# Patient Record
Sex: Female | Born: 1985
Health system: Southern US, Community
[De-identification: ages and names within clinical notes are randomized; demographics above are authoritative.]

## PROBLEM LIST (undated history)

## (undated) DIAGNOSIS — T7840XA Allergy, unspecified, initial encounter: Secondary | ICD-10-CM

## (undated) DIAGNOSIS — J45909 Unspecified asthma, uncomplicated: Secondary | ICD-10-CM

## (undated) DIAGNOSIS — D649 Anemia, unspecified: Secondary | ICD-10-CM

## (undated) HISTORY — DX: Anemia, unspecified: D64.9

## (undated) HISTORY — PX: ADENOIDECTOMY: SUR15

## (undated) HISTORY — DX: Unspecified asthma, uncomplicated: J45.909

## (undated) HISTORY — DX: Allergy, unspecified, initial encounter: T78.40XA

## (undated) HISTORY — PX: TUBAL LIGATION: SHX77

## (undated) HISTORY — PX: TONSILLECTOMY: SUR1361

---

## 2011-03-02 ENCOUNTER — Emergency Department (HOSPITAL_BASED_OUTPATIENT_CLINIC_OR_DEPARTMENT_OTHER)
Admission: EM | Admit: 2011-03-02 | Discharge: 2011-03-02 | Disposition: A | Discharge status: Home / Self Care | Payer: Self-pay | Attending: Emergency Medicine | Admitting: Emergency Medicine

## 2011-03-02 ENCOUNTER — Encounter: Payer: Self-pay | Admitting: Emergency Medicine

## 2011-03-02 DIAGNOSIS — N390 Urinary tract infection, site not specified: Secondary | ICD-10-CM | POA: Insufficient documentation

## 2011-03-02 DIAGNOSIS — R109 Unspecified abdominal pain: Secondary | ICD-10-CM | POA: Insufficient documentation

## 2011-03-02 DIAGNOSIS — K625 Hemorrhage of anus and rectum: Secondary | ICD-10-CM | POA: Insufficient documentation

## 2011-03-02 LAB — WET PREP, GENITAL: Yeast Wet Prep HPF POC: NONE SEEN

## 2011-03-02 LAB — URINALYSIS, ROUTINE W REFLEX MICROSCOPIC
Ketones, ur: NEGATIVE mg/dL
Leukocytes, UA: NEGATIVE
Nitrite: NEGATIVE
Specific Gravity, Urine: 1.024 (ref 1.005–1.030)
pH: 5.5 (ref 5.0–8.0)

## 2011-03-02 LAB — PREGNANCY, URINE: Preg Test, Ur: NEGATIVE

## 2011-03-02 LAB — URINE MICROSCOPIC-ADD ON

## 2011-03-02 MED ORDER — NITROFURANTOIN MONOHYD MACRO 100 MG PO CAPS: 100.0000 mg | ORAL_CAPSULE | Freq: Two times a day (BID) | ORAL | Status: DC

## 2011-03-02 NOTE — ED Notes (Signed)
Pt reports lower abd pain with blood in stool x 1 episode yesterday.

## 2011-03-02 NOTE — ED Provider Notes (Signed)
History     CSN: [id number] Arrival date & time: 03/02/2011  6:36 AM   First MD Initiated Contact with Patient 03/02/11 [phone]      Chief Complaint  Patient presents with  . Abdominal Pain  . Rectal Bleeding    (Consider location/radiation/quality/duration/timing/severity/associated sxs/prior treatment) Patient is a 25 y.o. female presenting with abdominal pain and hematochezia. The history is provided by the patient.  Abdominal Pain The primary symptoms of the illness include abdominal pain and vaginal bleeding. The primary symptoms of the illness do not include fever, shortness of breath, nausea, vomiting, diarrhea, hematochezia, dysuria or vaginal discharge. The current episode started 2 days ago. The onset of the illness was gradual. The problem has been gradually worsening.  The abdominal pain is located in the suprapubic region. The abdominal pain radiates to the back. The severity of the abdominal pain is 3/10. The abdominal pain is relieved by nothing. The abdominal pain is exacerbated by certain positions and movement.  Onset: finishing her normal menses. The quantity of blood was typical of menses.  Additional symptoms associated with the illness include back pain. Symptoms associated with the illness do not include chills or anorexia.  Rectal Bleeding  Associated symptoms include abdominal pain and vaginal bleeding. Pertinent negatives include no anorexia, no fever, no diarrhea, no nausea, no vomiting and no vaginal discharge.    History reviewed. No pertinent past medical history.  History reviewed. No pertinent past surgical history.  No family history on file.  History  Substance Use Topics  . Smoking status: Never Smoker   . Smokeless tobacco: Not on file  . Alcohol Use: No    OB History    Grav Para Term Preterm Abortions TAB SAB Ect Mult Living                  Review of Systems  Constitutional: Negative for fever and chills.  Respiratory: Negative for  shortness of breath.   Gastrointestinal: Positive for abdominal pain. Negative for nausea, vomiting, diarrhea, hematochezia and anorexia.  Genitourinary: Positive for vaginal bleeding. Negative for dysuria and vaginal discharge.  Musculoskeletal: Positive for back pain.  All other systems reviewed and are negative.    Allergies  Penicillins  Home Medications   Current Outpatient Rx  Name Route Sig Dispense Refill  . NITROFURANTOIN MONOHYD MACRO 100 MG PO CAPS Oral Take 1 capsule (100 mg total) by mouth 2 (two) times daily. 14 capsule 0    BP 110/66  Pulse 71  Temp(Src) 97.7 F (36.5 C) (Oral)  Resp 18  SpO2 100%  LMP 02/25/2011  Physical Exam  Nursing note and vitals reviewed. Constitutional: She is oriented to person, place, and time. She appears well-developed and well-nourished. No distress.  HENT:  Head: Normocephalic and atraumatic.  Eyes: EOM are normal. Pupils are equal, round, and reactive to light.  Cardiovascular: Normal rate, regular rhythm, normal heart sounds and intact distal pulses.  Exam reveals no friction rub.   No murmur heard. Pulmonary/Chest: Effort normal and breath sounds normal. She has no wheezes. She has no rales.  Abdominal: Soft. Bowel sounds are normal. She exhibits no distension. There is tenderness in the suprapubic area. There is CVA tenderness. There is no rebound and no guarding.  Genitourinary: Uterus normal. Cervix exhibits no motion tenderness and no discharge. Right adnexum displays no mass and no tenderness. Left adnexum displays no mass and no tenderness. There is bleeding around the vagina. No vaginal discharge found.  Musculoskeletal: Normal range of  motion. She exhibits no tenderness.       No edema  Neurological: She is alert and oriented to person, place, and time. No cranial nerve deficit.  Skin: Skin is warm and dry. No rash noted.  Psychiatric: She has a normal mood and affect. Her behavior is normal.    ED Course    Procedures (including critical care time)  Results for orders placed during the hospital encounter of 03/02/11  URINALYSIS, ROUTINE W REFLEX MICROSCOPIC      Component Value Range   Color, Urine YELLOW  YELLOW    Appearance CLOUDY (*) CLEAR    Specific Gravity, Urine 1.024  1.005 - 1.030    pH 5.5  5.0 - 8.0    Glucose, UA NEGATIVE  NEGATIVE (mg/dL)   Hgb urine dipstick LARGE (*) NEGATIVE    Bilirubin Urine NEGATIVE  NEGATIVE    Ketones, ur NEGATIVE  NEGATIVE (mg/dL)   Protein, ur NEGATIVE  NEGATIVE (mg/dL)   Urobilinogen, UA 0.2  0.0 - 1.0 (mg/dL)   Nitrite NEGATIVE  NEGATIVE    Leukocytes, UA NEGATIVE  NEGATIVE   PREGNANCY, URINE      Component Value Range   Preg Test, Ur NEGATIVE    URINE MICROSCOPIC-ADD ON      Component Value Range   Squamous Epithelial / LPF RARE  RARE    WBC, UA 3-6  <3 (WBC/hpf)   RBC / HPF 3-6  <3 (RBC/hpf)   Bacteria, UA MANY (*) RARE    Urine-Other MUCOUS PRESENT    WET PREP, GENITAL      Component Value Range   Yeast, Wet Prep NONE SEEN  NONE SEEN    Trich, Wet Prep NONE SEEN  NONE SEEN    Clue Cells, Wet Prep NONE SEEN  NONE SEEN    WBC, Wet Prep HPF POC FEW (*) NONE SEEN    No results found.    1. UTI (lower urinary tract infection)       MDM   Pt with lower abd/suprapubic pain that is some in the back.  No RLQ pain concerning for appy.  Pt is married and denies any different sexual partners making PID unlikely. Denies fever, vomiting, or SOB.  No sx suggestive of pyelonephritis.  Denies any hx of ovarian cysts in the past. When asked about the blood in stool she states just some small streaks of blood which is most likely from constipation.  Pelvic exam without CMT and no adnexal tenderness.  UA suggestive of UTI with many bacteria.  Wet prep with only few WBC's but no trich or yeast.  Doubt gc/chlamydia. Will cover with macrobid and pt knows to return for worsening pain, vomiting or fever.        Gwyneth Sprout,  MD 03/02/11 [id number]

## 2011-03-02 NOTE — ED Notes (Signed)
Warm blankets given.

## 2011-03-03 LAB — GC/CHLAMYDIA PROBE AMP, GENITAL
Chlamydia, DNA Probe: NEGATIVE
GC Probe Amp, Genital: NEGATIVE

## 2011-04-17 ENCOUNTER — Encounter: Payer: Self-pay | Admitting: Internal Medicine

## 2011-04-17 DIAGNOSIS — Z0000 Encounter for general adult medical examination without abnormal findings: Secondary | ICD-10-CM | POA: Insufficient documentation

## 2011-04-20 ENCOUNTER — Ambulatory Visit: Payer: Self-pay | Admitting: Internal Medicine

## 2011-04-20 DIAGNOSIS — Z0289 Encounter for other administrative examinations: Secondary | ICD-10-CM

## 2012-02-06 ENCOUNTER — Ambulatory Visit (INDEPENDENT_AMBULATORY_CARE_PROVIDER_SITE_OTHER): Payer: BC Managed Care – PPO | Admitting: Family Medicine

## 2012-02-06 VITALS — BP 104/64 | HR 83 | Temp 98.2°F | Resp 16 | Ht 61.0 in | Wt 112.4 lb

## 2012-02-06 DIAGNOSIS — R109 Unspecified abdominal pain: Secondary | ICD-10-CM

## 2012-02-06 DIAGNOSIS — M436 Torticollis: Secondary | ICD-10-CM

## 2012-02-06 DIAGNOSIS — N898 Other specified noninflammatory disorders of vagina: Secondary | ICD-10-CM

## 2012-02-06 LAB — POCT WET PREP WITH KOH
Epithelial Wet Prep HPF POC: NEGATIVE
KOH Prep POC: NEGATIVE
Trichomonas, UA: NEGATIVE
WBC Wet Prep HPF POC: NEGATIVE
Yeast Wet Prep HPF POC: NEGATIVE

## 2012-02-06 LAB — POCT UA - MICROSCOPIC ONLY
Casts, Ur, LPF, POC: NEGATIVE
Crystals, Ur, HPF, POC: NEGATIVE

## 2012-02-06 LAB — POCT URINE PREGNANCY: Preg Test, Ur: NEGATIVE

## 2012-02-06 LAB — POCT URINALYSIS DIPSTICK
Ketones, UA: NEGATIVE
Protein, UA: NEGATIVE
Urobilinogen, UA: 1

## 2012-02-06 MED ORDER — METRONIDAZOLE 500 MG PO TABS: 500.0000 mg | ORAL_TABLET | Freq: Three times a day (TID) | ORAL | Status: DC

## 2012-02-06 NOTE — Patient Instructions (Addendum)
Bacterial Vaginosis Bacterial vaginosis (BV) is a vaginal infection where the normal balance of bacteria in the vagina is disrupted. The normal balance is then replaced by an overgrowth of certain bacteria. There are several different kinds of bacteria that can cause BV. BV is the most common vaginal infection in women of childbearing age. CAUSES   The cause of BV is not fully understood. BV develops when there is an increase or imbalance of harmful bacteria.  Some activities or behaviors can upset the normal balance of bacteria in the vagina and put women at increased risk including:  Having a new sex partner or multiple sex partners.  Douching.  Using an intrauterine device (IUD) for contraception.  It is not clear what role sexual activity plays in the development of BV. However, women that have never had sexual intercourse are rarely infected with BV. Women do not get BV from toilet seats, bedding, swimming pools or from touching objects around them.  SYMPTOMS   Grey vaginal discharge.  A fish-like odor with discharge, especially after sexual intercourse.  Itching or burning of the vagina and vulva.  Burning or pain with urination.  Some women have no signs or symptoms at all. DIAGNOSIS  Your caregiver must examine the vagina for signs of BV. Your caregiver will perform lab tests and look at the sample of vaginal fluid through a microscope. They will look for bacteria and abnormal cells (clue cells), a pH test higher than 4.5, and a positive amine test all associated with BV.  RISKS AND COMPLICATIONS   Pelvic inflammatory disease (PID).  Infections following gynecology surgery.  Developing HIV.  Developing herpes virus. TREATMENT  Sometimes BV will clear up without treatment. However, all women with symptoms of BV should be treated to avoid complications, especially if gynecology surgery is planned. Female partners generally do not need to be treated. However, BV may spread  between female sex partners so treatment is helpful in preventing a recurrence of BV.   BV may be treated with antibiotics. The antibiotics come in either pill or vaginal cream forms. Either can be used with nonpregnant or pregnant women, but the recommended dosages differ. These antibiotics are not harmful to the baby.  BV can recur after treatment. If this happens, a second round of antibiotics will often be prescribed.  Treatment is important for pregnant women. If not treated, BV can cause a premature delivery, especially for a pregnant woman who had a premature birth in the past. All pregnant women who have symptoms of BV should be checked and treated.  For chronic reoccurrence of BV, treatment with a type of prescribed gel vaginally twice a week is helpful. HOME CARE INSTRUCTIONS   Finish all medication as directed by your caregiver.  Do not have sex until treatment is completed.  Tell your sexual partner that you have a vaginal infection. They should see their caregiver and be treated if they have problems, such as a mild rash or itching.  Practice safe sex. Use condoms. Only have 1 sex partner. PREVENTION  Basic prevention steps can help reduce the risk of upsetting the natural balance of bacteria in the vagina and developing BV:  Do not have sexual intercourse (be abstinent).  Do not douche.  Use all of the medicine prescribed for treatment of BV, even if the signs and symptoms go away.  Tell your sex partner if you have BV. That way, they can be treated, if needed, to prevent reoccurrence. SEEK MEDICAL CARE IF:     Your symptoms are not improving after 3 days of treatment.  You have increased discharge, pain, or fever. MAKE SURE YOU:   Understand these instructions.  Will watch your condition.  Will get help right away if you are not doing well or get worse. FOR MORE INFORMATION  Division of STD Prevention (DSTDP), Centers for Disease Control and Prevention:  www.cdc.gov/std American [employer] Association (ASHA): www.ashastd.org  Document Released: 03/22/2005 Document Revised: 06/14/2011 Document Reviewed: 09/12/2008 [employer] Patient Information 2013 [employer].  

## 2012-02-06 NOTE — Progress Notes (Signed)
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  Notices foul odor as well.    Bleeding after intercourse.  Married, monogamous.  Dyspareunia.  Stressed with job, marriage, son recently diagnosed with Crohn's disease  LMP:  Sept 15th G3P3 S/P tubal ligation Last pap 2 years ago, normal  Objective:  NAD  Results for orders placed in visit on 02/06/12  POCT URINALYSIS DIPSTICK      Component Value Range   Color, UA yellow     Clarity, UA clear     Glucose, UA neg     Bilirubin, UA neg     Ketones, UA neg     Spec Grav, UA >=1.030     Blood, UA neg     pH, UA 5.5     Protein, UA neg     Urobilinogen, UA 1.0     Nitrite, UA neg     Leukocytes, UA Trace     Breast exam ( at patient's request): Normal Pelvic exam: Normal cervix, scant vaginal discharge, no friability of the cervix, normal bimanual, and normal external female genitalia  Assessment: Vaginitis  1. Abdominal  pain, other specified site  POCT UA - Microscopic Only, POCT urinalysis dipstick, POCT urine pregnancy, POCT Wet Prep with KOH, GC/chlamydia probe amp, genital  2. Vaginal discharge  POCT Wet Prep with KOH, GC/chlamydia probe amp, genital, metroNIDAZOLE (FLAGYL) 500 MG tablet

## 2012-02-08 LAB — GC/CHLAMYDIA PROBE AMP, GENITAL
Chlamydia, DNA Probe: NEGATIVE
GC Probe Amp, Genital: NEGATIVE

## 2012-03-10 ENCOUNTER — Encounter: Payer: Self-pay | Admitting: Family Medicine

## 2013-01-06 ENCOUNTER — Encounter (HOSPITAL_COMMUNITY): Payer: Self-pay | Admitting: *Deleted

## 2013-01-06 ENCOUNTER — Emergency Department (HOSPITAL_COMMUNITY): Payer: BC Managed Care – PPO

## 2013-01-06 ENCOUNTER — Emergency Department (HOSPITAL_COMMUNITY)
Admission: EM | Admit: 2013-01-06 | Discharge: 2013-01-06 | Disposition: A | Discharge status: Home / Self Care | Payer: BC Managed Care – PPO | Attending: Emergency Medicine | Admitting: Emergency Medicine

## 2013-01-06 DIAGNOSIS — Z792 Long term (current) use of antibiotics: Secondary | ICD-10-CM | POA: Insufficient documentation

## 2013-01-06 DIAGNOSIS — Z880 Allergy status to penicillin: Secondary | ICD-10-CM | POA: Insufficient documentation

## 2013-01-06 DIAGNOSIS — Y929 Unspecified place or not applicable: Secondary | ICD-10-CM | POA: Insufficient documentation

## 2013-01-06 DIAGNOSIS — S9030XA Contusion of unspecified foot, initial encounter: Secondary | ICD-10-CM | POA: Insufficient documentation

## 2013-01-06 DIAGNOSIS — W208XXA Other cause of strike by thrown, projected or falling object, initial encounter: Secondary | ICD-10-CM | POA: Insufficient documentation

## 2013-01-06 DIAGNOSIS — Y9389 Activity, other specified: Secondary | ICD-10-CM | POA: Insufficient documentation

## 2013-01-06 DIAGNOSIS — S9032XA Contusion of left foot, initial encounter: Secondary | ICD-10-CM

## 2013-01-06 MED ORDER — IBUPROFEN 800 MG PO TABS: 800.0000 mg | ORAL_TABLET | Freq: Three times a day (TID) | ORAL | Status: DC

## 2013-01-06 MED ORDER — IBUPROFEN 400 MG PO TABS
800.0000 mg | ORAL_TABLET | Freq: Once | ORAL | Status: AC
  Administered 2013-01-06: 800 mg via ORAL
  Filled 2013-01-06: qty 2

## 2013-01-06 NOTE — ED Provider Notes (Signed)
CSN: [id number]     Arrival date & time 01/06/13  1103 History  This chart was scribed for non-physician practitioner, Trixie Dredge, PA-C working with Shanna Cisco, MD by Greggory Stallion, ED scribe. This patient was seen in room TR06C/TR06C and the patient's care was started at 11:37 AM.   Chief Complaint  Patient presents with  . Foot Injury   The history is provided by the patient. No language interpreter was used.    HPI Comments: Nancy Mcmahon is a 27 y.o. female who presents to the Emergency Department complaining of left foot injury that occurred about one hour ago. She states she dropped a full laundry basket on her foot. Pt is now having sudden onset, constant left foot pain with associated mild swelling. She states movement and palpation worsen the pain. Pt denies weakness and numbness. Denies other injury.   History reviewed. No pertinent past medical history. History reviewed. No pertinent past surgical history. Family History  Problem Relation Age of Onset  . Seizures Mother   . Cirrhosis Father   . Crohn's disease Sister   . Cancer Maternal Grandfather    History  Substance Use Topics  . Smoking status: Never Smoker   . Smokeless tobacco: Not on file  . Alcohol Use: No   OB History   Grav Para Term Preterm Abortions TAB SAB Ect Mult Living                 Review of Systems  Musculoskeletal: Positive for arthralgias.       Positive swelling to left dorsal foot.  Neurological: Negative for weakness and numbness.  All other systems reviewed and are negative.    Allergies  Penicillins  Home Medications   Current Outpatient Rx  Name  Route  Sig  Dispense  Refill  . metroNIDAZOLE (FLAGYL) 500 MG tablet   Oral   Take 1 tablet (500 mg total) by mouth 3 (three) times daily.   21 tablet   0    BP 121/64  Pulse 100  Temp(Src) 97.6 F (36.4 C) (Oral)  Resp 20  Wt 119 lb 6 oz (54.148 kg)  BMI 22.57 kg/m2  SpO2 100%  Physical Exam  Nursing note and  vitals reviewed. Constitutional: She appears well-developed and well-nourished. No distress.  HENT:  Head: Normocephalic and atraumatic.  Neck: Neck supple.  Pulmonary/Chest: Effort normal.  Musculoskeletal:  Diffuse tenderness over left lateral malleolus and left dorsal foot. Mild swelling over dorsal foot. No erythema. No ecchymosis. No tenderness over plantar aspect of foot. Capillary refill less than 2 seconds on all digits. Pt has decreased active ROM over all digits and ankle secondary to pain. Left calf is non tender. Skin is intact.   Neurological: She is alert.  Sensation intact.   Skin: She is not diaphoretic.    ED Course  Procedures (including critical care time)  DIAGNOSTIC STUDIES: Oxygen Saturation is 100% on RA, normal by my interpretation.    COORDINATION OF CARE: 11:40 AM-Discussed treatment plan which includes xrays with pt at bedside and pt agreed to plan.   Labs Review Labs Reviewed - No data to display Imaging Review Dg Ankle Complete Left  01/06/2013   CLINICAL DATA:  Injury with left ankle pain.  EXAM: LEFT ANKLE COMPLETE - 3+ VIEW  COMPARISON:  None.  FINDINGS: There is no evidence of fracture, dislocation, or joint effusion. There is no evidence of arthropathy or other focal bone abnormality. Soft tissues are unremarkable.  IMPRESSION:  Negative.   Electronically Signed   By: Irish Lack M.D.   On: 01/06/2013 12:25   Dg Foot Complete Left  01/06/2013   CLINICAL DATA:  Injury with left foot pain.  EXAM: LEFT FOOT - COMPLETE 3+ VIEW  COMPARISON:  None.  FINDINGS: There is no evidence of fracture or dislocation. There is no evidence of arthropathy or other focal bone abnormality. Soft tissues are unremarkable.  IMPRESSION: Negative.   Electronically Signed   By: Irish Lack M.D.   On: 01/06/2013 12:28    MDM   1. Foot contusion, left, initial encounter      Pt p/w pain and swelling over left dorsal foot and anterior/lateral ankle after dropping a  laundry basket full of clothes on her foot.  Pt with significant tenderness but no ecchymosis, no break in skin.  Neurovascularly intact.  Xrays negative Pt given ice, ibuprofen in ED. D/C home with RICE, ace wrap, crutches.  Discussed all results with patient.  Pt given return precautions.  Pt verbalizes understanding and agrees with plan.         I personally performed the services described in this documentation, which was scribed in my presence. The recorded information has been reviewed and is accurate.   Trixie Dredge, PA-C 01/06/13 1241

## 2013-01-06 NOTE — ED Notes (Signed)
PT IS HERE WITH LEFT FOOT INJURY AFTER DROPPING A FULL LAUNDRY BASKET ON IT TODAY.

## 2013-01-06 NOTE — ED Provider Notes (Signed)
Medical screening examination/treatment/procedure(s) were performed by non-physician practitioner and as supervising physician I was immediately available for consultation/collaboration.  Kiyoko Mcguirt E Geneva Pallas, MD 01/06/13 1244 

## 2014-10-19 ENCOUNTER — Ambulatory Visit (INDEPENDENT_AMBULATORY_CARE_PROVIDER_SITE_OTHER): Payer: BC Managed Care – PPO | Admitting: Family Medicine

## 2014-10-19 VITALS — BP 110/60 | HR 98 | Temp 98.7°F | Resp 18 | Ht 61.0 in | Wt 123.1 lb

## 2014-10-19 DIAGNOSIS — R102 Pelvic and perineal pain: Secondary | ICD-10-CM | POA: Diagnosis not present

## 2014-10-19 DIAGNOSIS — N898 Other specified noninflammatory disorders of vagina: Secondary | ICD-10-CM

## 2014-10-19 DIAGNOSIS — B373 Candidiasis of vulva and vagina: Secondary | ICD-10-CM

## 2014-10-19 DIAGNOSIS — B3731 Acute candidiasis of vulva and vagina: Secondary | ICD-10-CM

## 2014-10-19 LAB — POCT WET PREP WITH KOH
Clue Cells Wet Prep HPF POC: NEGATIVE
KOH PREP POC: POSITIVE
RBC WET PREP PER HPF POC: NEGATIVE
Trichomonas, UA: NEGATIVE

## 2014-10-19 LAB — POCT UA - MICROSCOPIC ONLY
CASTS, UR, LPF, POC: NEGATIVE
Crystals, Ur, HPF, POC: NEGATIVE
YEAST UA: NEGATIVE

## 2014-10-19 LAB — POCT URINALYSIS DIPSTICK
BILIRUBIN UA: NEGATIVE
GLUCOSE UA: NEGATIVE
KETONES UA: NEGATIVE
LEUKOCYTES UA: NEGATIVE
NITRITE UA: NEGATIVE
PH UA: 5
Protein, UA: NEGATIVE
Spec Grav, UA: >=1.03
UROBILINOGEN UA: 0.2

## 2014-10-19 MED ORDER — FLUCONAZOLE 150 MG PO TABS: 150.0000 mg | ORAL_TABLET | Freq: Once | ORAL | Status: DC

## 2014-10-19 NOTE — Patient Instructions (Addendum)
Take fluconazole 150 mg single dose for yeast. If it does not seem completely gone within about 3 days go ahead and take the second pill, otherwise he did not need it.  If you keep having excessive pains and gas pain come in for recheck.  Vaginitis Vaginitis is an inflammation of the vagina. It is most often caused by a change in the normal balance of the bacteria and yeast that live in the vagina. This change in balance causes an overgrowth of certain bacteria or yeast, which causes the inflammation. There are different types of vaginitis, but the most common types are:  Bacterial vaginosis.  Yeast infection (candidiasis).  Trichomoniasis vaginitis. This is a sexually transmitted infection (STI).  Viral vaginitis.  Atropic vaginitis.  Allergic vaginitis. CAUSES  The cause depends on the type of vaginitis. Vaginitis can be caused by:  Bacteria (bacterial vaginosis).  Yeast (yeast infection).  A parasite (trichomoniasis vaginitis)  A virus (viral vaginitis).  Low hormone levels (atrophic vaginitis). Low hormone levels can occur during pregnancy, breastfeeding, or after menopause.  Irritants, such as bubble baths, scented tampons, and feminine sprays (allergic vaginitis). Other factors can change the normal balance of the yeast and bacteria that live in the vagina. These include:  Antibiotic medicines.  Poor hygiene.  Diaphragms, vaginal sponges, spermicides, birth control pills, and intrauterine devices (IUD).  Sexual intercourse.  Infection.  Uncontrolled diabetes.  A weakened immune system. SYMPTOMS  Symptoms can vary depending on the cause of the vaginitis. Common symptoms include:  Abnormal vaginal discharge.  The discharge is white, gray, or yellow with bacterial vaginosis.  The discharge is thick, white, and cheesy with a yeast infection.  The discharge is frothy and yellow or greenish with trichomoniasis.  A bad vaginal odor.  The odor is fishy with  bacterial vaginosis.  Vaginal itching, pain, or swelling.  Painful intercourse.  Pain or burning when urinating. Sometimes, there are no symptoms. TREATMENT  Treatment will vary depending on the type of infection.   Bacterial vaginosis and trichomoniasis are often treated with antibiotic creams or pills.  Yeast infections are often treated with antifungal medicines, such as vaginal creams or suppositories.  Viral vaginitis has no cure, but symptoms can be treated with medicines that relieve discomfort. Your sexual partner should be treated as well.  Atrophic vaginitis may be treated with an estrogen cream, pill, suppository, or vaginal ring. If vaginal dryness occurs, lubricants and moisturizing creams may help. You may be told to avoid scented soaps, sprays, or douches.  Allergic vaginitis treatment involves quitting the use of the product that is causing the problem. Vaginal creams can be used to treat the symptoms. HOME CARE INSTRUCTIONS   Take all medicines as directed by your caregiver.  Keep your genital area clean and dry. Avoid soap and only rinse the area with water.  Avoid douching. It can remove the healthy bacteria in the vagina.  Do not use tampons or have sexual intercourse until your vaginitis has been treated. Use sanitary pads while you have vaginitis.  Wipe from front to back. This avoids the spread of bacteria from the rectum to the vagina.  Let air reach your genital area.  Wear cotton underwear to decrease moisture buildup.  Avoid wearing underwear while you sleep until your vaginitis is gone.  Avoid tight pants and underwear or nylons without a cotton panel.  Take off wet clothing (especially bathing suits) as soon as possible.  Use mild, non-scented products. Avoid using irritants, such as:  Scented feminine sprays.  Fabric softeners.  Scented detergents.  Scented tampons.  Scented soaps or bubble baths.  Practice safe sex and use condoms.  Condoms may prevent the spread of trichomoniasis and viral vaginitis. SEEK MEDICAL CARE IF:   You have abdominal pain.  You have a fever or persistent symptoms for more than 2-3 days.  You have a fever and your symptoms suddenly get worse. Document Released: 01/17/2007 Document Revised: 12/15/2011 Document Reviewed: 09/02/2011 [facility] Patient Information 2015 [place], [place]. This information is not intended to replace advice given to you by your health care provider. Make sure you discuss any questions you have with your health care provider.

## 2014-10-19 NOTE — Progress Notes (Signed)
Subjective:  Patient ID: Fontaine No, female    DOB: 04-07-1985  Age: 29 y.o. MRN: [id number]  29 year old female who has been having pelvic discomfort and a vaginal discharge for the last few days. The labia feel swollen. She says she's been red down there. She has seen a little bit of dark blood. She's not had any abdominal pain except for the chronic abdominal pain that she always has had for years. She has 3 children and a fourth child she is raising. She works full-time. She is married. Has not had intercourse since early in the week. She uses powder around her vaginal orifice and wondered whether she got some in there to irritate things.   Objective:   Abdomen is soft with some mild nonspecific low abdominal tenderness. Normal external genitalia. It really doesn't look terribly swollen even though feels that way to her. Minimal erythema. Vaginal mucosa looks normal except for black old blood discharge is very thick and clumps. This was swabbed out. Wet prep was taken. Bimanual exam reveals uterus to be multiparous, retroflexed somewhat, no adnexal or uterine masses could be appreciated. No significant tenderness.  Results for orders placed or performed in visit on 10/19/14  POCT Wet Prep with KOH  Result Value Ref Range   Trichomonas, UA Negative    Clue Cells Wet Prep HPF POC Negative    Epithelial Wet Prep HPF POC Few Few, Moderate, Many   Yeast Wet Prep HPF POC Few    Bacteria Wet Prep HPF POC Many (A) Few   RBC Wet Prep HPF POC Negative    WBC Wet Prep HPF POC Rare    KOH Prep POC Positive   POCT UA - Microscopic Only  Result Value Ref Range   WBC, Ur, HPF, POC 1-5    RBC, urine, microscopic 0-1    Bacteria, U Microscopic 2+    Mucus, UA Small    Epithelial cells, urine per micros 1-5    Crystals, Ur, HPF, POC Negative    Casts, Ur, LPF, POC Negative    Yeast, UA Negative   POCT urinalysis dipstick  Result Value Ref Range   Color, UA yellow    Clarity, UA clear    Glucose, UA negative    Bilirubin, UA negative    Ketones, UA negative    Spec Grav, UA >=1.030    Blood, UA trace-lysed    pH, UA 5.0    Protein, UA negative    Urobilinogen, UA 0.2    Nitrite, UA negative    Leukocytes, UA Negative Negative        Assessment & Plan:   Assessment: monilia vaginitis  Plan: Patient Instructions  Take fluconazole 150 mg single dose for yeast. If it does not seem completely gone within about 3 days go ahead and take the second pill, otherwise he did not need it.  If you keep having excessive pains and gas pain come in for recheck.     Allenmichael Mcpartlin, MD 10/19/2014

## 2016-04-06 ENCOUNTER — Ambulatory Visit (INDEPENDENT_AMBULATORY_CARE_PROVIDER_SITE_OTHER): Payer: BC Managed Care – PPO | Admitting: Physician Assistant

## 2016-04-06 VITALS — BP 102/78 | HR 90 | Temp 98.6°F | Resp 16 | Ht 61.0 in | Wt 121.0 lb

## 2016-04-06 DIAGNOSIS — R109 Unspecified abdominal pain: Secondary | ICD-10-CM

## 2016-04-06 MED ORDER — OMEPRAZOLE 20 MG PO CPDR: 20.0000 mg | DELAYED_RELEASE_CAPSULE | Freq: Every day | ORAL | 1 refills | Status: DC

## 2016-04-06 MED ORDER — RANITIDINE HCL 150 MG PO TABS: 150.0000 mg | ORAL_TABLET | Freq: Two times a day (BID) | ORAL | 0 refills | Status: DC

## 2016-04-06 NOTE — Patient Instructions (Addendum)
Please continue to keep a food diary.   Please lower your caffeine intake.  If you continue to have these symptoms, I would like you to follow up with [place] in 4 weeks.  Sooner if your symptoms worsen.   You can also use a probiotic supplement with the medication prescribed. Food Choices for Gastroesophageal Reflux Disease, Adult When you have gastroesophageal reflux disease (GERD), the foods you eat and your eating habits are very important. Choosing the right foods can help ease your discomfort. What guidelines do I need to follow?  Choose fruits, vegetables, whole grains, and low-fat dairy products.  Choose low-fat meat, fish, and poultry.  Limit fats such as oils, salad dressings, butter, nuts, and avocado.  Keep a food diary. This helps you identify foods that cause symptoms.  Avoid foods that cause symptoms. These may be different for everyone.  Eat small meals often instead of 3 large meals a day.  Eat your meals slowly, in a place where you are relaxed.  Limit fried foods.  Cook foods using methods other than frying.  Avoid drinking alcohol.  Avoid drinking large amounts of liquids with your meals.  Avoid bending over or lying down until 2-3 hours after eating. What foods are not recommended? These are some foods and drinks that may make your symptoms worse: Vegetables  Tomatoes. Tomato juice. Tomato and spaghetti sauce. Chili peppers. Onion and garlic. Horseradish. Fruits  Oranges, grapefruit, and lemon (fruit and juice). Meats  High-fat meats, fish, and poultry. This includes hot dogs, ribs, ham, sausage, salami, and bacon. Dairy  Whole milk and chocolate milk. Sour cream. Cream. Butter. Ice cream. Cream cheese. Drinks  Coffee and tea. Bubbly (carbonated) drinks or energy drinks. Condiments  Hot sauce. Barbecue sauce. Sweets/Desserts  Chocolate and cocoa. Donuts. Peppermint and spearmint. Fats and Oils  High-fat foods. This includes [place] fries and potato  chips. Other  Vinegar. Strong spices. This includes black pepper, white pepper, red pepper, cayenne, curry powder, cloves, ginger, and chili powder. The items listed above may not be a complete list of foods and drinks to avoid. Contact your dietitian for more information.  This information is not intended to replace advice given to you by your health care provider. Make sure you discuss any questions you have with your health care provider. Document Released: 09/21/2011 Document Revised: 08/28/2015 Document Reviewed: 01/24/2013 Elsevier Interactive Patient Education  2017 [employer].

## 2016-04-06 NOTE — Progress Notes (Signed)
[facility] [address] [place] [address] [phone]  Date:  04/06/2016   Name:  Nancy Mcmahon   DOB:  1986/03/07   MRN:  [id number]  PCP:  Cathlean Cower, MD    History of Present Illness:  Nancy Mcmahon is a 31 y.o. female patient who presents to [facility] for abdominal pain and nausea.    6 months of abdomianl pain at the lower gi and down radiates through back, and at times down legs.  She has nausea associated pain.  No diarrhea or constipation.   Anytime she eats onions, tomatoes, or red sauce, the pain is worsened.   Drink insures, coffee (2 cups).  Son has hx of crohn's disease.  Pain subsides.   She is taking ibuprofen 4 times per week, 400mg  per week. Soda intake rare EtOH: none.   Patient Active Problem List   Diagnosis Date Noted  . Preventative health care 04/17/2011    Past Medical History:  Diagnosis Date  . Allergy     Past Surgical History:  Procedure Laterality Date  . TUBAL LIGATION      Social History  Substance Use Topics  . Smoking status: Never Smoker  . Smokeless tobacco: Not on file  . Alcohol use No    Family History  Problem Relation Age of Onset  . Seizures Mother   . Cirrhosis Father   . Crohn's disease Sister   . Cancer Maternal Grandfather     Allergies  Allergen Reactions  . Penicillins Rash    Medication list has been reviewed and updated.  Current Outpatient Prescriptions on File Prior to Visit  Medication Sig Dispense Refill  . fluconazole (DIFLUCAN) 150 MG tablet Take 1 tablet (150 mg total) by mouth once. (Patient not taking: Reported on 04/06/2016) 2 tablet 0  . ibuprofen (ADVIL,MOTRIN) 200 MG tablet Take 400 mg by mouth every 6 (six) hours as needed for pain.     No current facility-administered medications on file prior to visit.     ROS ROS otherwise unremarkable unless listed above.   Physical Examination: BP 102/78 (BP Location: Right Arm, Patient Position: Sitting, Cuff Size: Normal)    Pulse 90   Temp 98.6 F (37 C)   Resp 16   Ht 5\' 1"  (1.549 m)   Wt 121 lb (54.9 kg)   LMP 03/25/2016   SpO2 100%   BMI 22.86 kg/m  Ideal Body Weight: Weight in (lb) to have BMI = 25: 132  Physical Exam  Constitutional: She is oriented to person, place, and time. She appears well-developed and well-nourished. No distress.  HENT:  Head: Normocephalic and atraumatic.  Right Ear: External ear normal.  Left Ear: External ear normal.  Eyes: Conjunctivae and EOM are normal. Pupils are equal, round, and reactive to light.  Cardiovascular: Normal rate.   Pulmonary/Chest: Effort normal. No respiratory distress.  Abdominal: Soft. Normal appearance and bowel sounds are normal.  Neurological: She is alert and oriented to person, place, and time.  Skin: She is not diaphoretic.  Psychiatric: She has a normal mood and affect. Her behavior is normal.     Assessment and Plan: [name] is a 31 y.o. female who is here today for cc abdominal pain or nausea. Advised if pain continues without improvement within 2 weeks, return for workup.   Also given probiotic supplement.  Abdominal pain, unspecified abdominal location - Plan: ranitidine (ZANTAC) 150 MG tablet, omeprazole (PRILOSEC) 20 MG capsule  Ivar Drape, PA-C [facility] 1/7/20185:42 PM

## 2016-08-12 ENCOUNTER — Ambulatory Visit (INDEPENDENT_AMBULATORY_CARE_PROVIDER_SITE_OTHER): Payer: BC Managed Care – PPO | Admitting: Family Medicine

## 2016-08-12 ENCOUNTER — Encounter: Payer: Self-pay | Admitting: Physician Assistant

## 2016-08-12 VITALS — BP 113/72 | HR 95 | Temp 98.2°F | Resp 18 | Ht 62.01 in | Wt 122.6 lb

## 2016-08-12 DIAGNOSIS — Z01419 Encounter for gynecological examination (general) (routine) without abnormal findings: Secondary | ICD-10-CM | POA: Diagnosis not present

## 2016-08-12 DIAGNOSIS — Z0000 Encounter for general adult medical examination without abnormal findings: Secondary | ICD-10-CM | POA: Diagnosis not present

## 2016-08-12 DIAGNOSIS — N898 Other specified noninflammatory disorders of vagina: Secondary | ICD-10-CM

## 2016-08-12 DIAGNOSIS — D509 Iron deficiency anemia, unspecified: Secondary | ICD-10-CM | POA: Diagnosis not present

## 2016-08-12 DIAGNOSIS — D239 Other benign neoplasm of skin, unspecified: Secondary | ICD-10-CM | POA: Diagnosis not present

## 2016-08-12 DIAGNOSIS — R1030 Lower abdominal pain, unspecified: Secondary | ICD-10-CM | POA: Insufficient documentation

## 2016-08-12 LAB — POCT WET + KOH PREP
TRICH BY WET PREP: ABSENT
YEAST BY KOH: ABSENT
YEAST BY WET PREP: ABSENT

## 2016-08-12 LAB — POCT URINALYSIS DIP (MANUAL ENTRY)
BILIRUBIN UA: NEGATIVE mg/dL
Bilirubin, UA: NEGATIVE
Blood, UA: NEGATIVE
Glucose, UA: NEGATIVE mg/dL
LEUKOCYTES UA: NEGATIVE
Nitrite, UA: NEGATIVE
PH UA: 5 (ref 5.0–8.0)
PROTEIN UA: NEGATIVE mg/dL
Spec Grav, UA: >=1.03 — AB (ref 1.010–1.025)
UROBILINOGEN UA: 0.2 U/dL

## 2016-08-12 LAB — POCT CBC
GRANULOCYTE PERCENT: 67.6 % (ref 37–80)
HCT, POC: 32 % — AB (ref 37.7–47.9)
HEMOGLOBIN: 10.5 g/dL — AB (ref 12.2–16.2)
Lymph, poc: 1.8 (ref 0.6–3.4)
MCH: 27.2 pg (ref 27–31.2)
MCHC: 32.7 g/dL (ref 31.8–35.4)
MCV: 83.2 fL (ref 80–97)
MID (cbc): 0.4 (ref 0–0.9)
MPV: 7.8 fL (ref 0–99.8)
POC Granulocyte: 4.7 (ref 2–6.9)
POC LYMPH PERCENT: 26.2 %L (ref 10–50)
POC MID %: 6.2 %M (ref 0–12)
Platelet Count, POC: 270 10*3/uL (ref 142–424)
RBC: 3.85 M/uL — AB (ref 4.04–5.48)
RDW, POC: 15.6 %
WBC: 7 10*3/uL (ref 4.6–10.2)

## 2016-08-12 LAB — POC MICROSCOPIC URINALYSIS (UMFC): MUCUS RE: ABSENT

## 2016-08-12 NOTE — Progress Notes (Signed)
Patient ID: Nancy Mcmahon, female    DOB: 30-Jun-1985  Age: 31 y.o. MRN: [id number]  Chief Complaint  Patient presents with  . Annual Exam    CPE    Subjective:   Annual physical examination:  History: Patient is here for an annual physical examination. She has no major complaints, but felt like it was time for her to get a good going over. It turns out she does have some chronic low abdominal pains and was a little concerned about them. She has generally been a very healthy person.  Past medical history: Operations: Bilateral tubal ligation Medical illnesses: None Allergies: Possibly penicillin causing hives Current medications: None Gynecological: Periods are regular, usually about 6 days. She has pain in the first 2 days of her cycle. Is gravida 3 para 3  Family history: Mother is alive and well. Father died of cirrhosis.  Social history: Does not drink or smoke or use substances. Her father's drinking made her turn away from all alcohol. Lives with her husband and 4 children, 3 of which were her natural children children under believe the fourth is adopted. Is sexually involved with her husband only. No fear sexually transmitted diseases.  Review of systems: Constitutional: Unremarkable HEENT: Unremarkable Cardiovascular: Unremarkable Respiratory: Unremarkable GI: Unremarkable except as noted above. She bowels move every 2-3 days.  GU unremarkable.  Musculoskeletal unremarkable.  Neurologic unremarkable.  Dermatologic the dermatofibromas as noted. Psychiatric unremarkable:    Current allergies, medications, problem list, past/family and social histories reviewed.  Objective:  BP 113/72 (BP Location: Right Arm, Patient Position: Sitting, Cuff Size: Small)   Pulse 95   Temp 98.2 F (36.8 C) (Oral)   Resp 18   Ht 5' 2.01" (1.575 m)   Wt 122 lb 9.6 oz (55.6 kg)   LMP 08/02/2016 (Approximate)   SpO2 98%   BMI 22.42 kg/m    Well-developed well-nourished young  lady in no acute distress. Her TMs are normal. Eyes PERRLA. Was glasses. Throat clear. Has the need of a little dental work. Neck supple without nodes or thyromegaly. No carotid bruits. She says her neck pops some but I couldn't get it to pop any on full range of motion. Chest is clear drawstrings. Heart regular without murmurs. Breasts are symmetrical, soft, no masses. No axillary nodes. Normal bowel sounds. Abdomen soft without masses. Mild nonspecific lower abdominal tenderness, slightly to the right of midline. Pelvic normal external genitalia. Vaginal mucosa unremarkable. Had a little bit of a whitish discharge charge so wet prep is taken. Bimanual exam reveals a multiparous uterus and large cervix. No masses. Ovaries could be palpated and deemed to be normal. Extremities unremarkable. Skin has a couple dermatofibromas, one on her left buttock, one on her left ankle, and 1 on her right leg believe.  Assessment & Plan:   Assessment: 1. Annual physical exam   2. Well woman exam   3. Lower abdominal pain   4. Dermatofibroma   5. Vaginal discharge   6. Iron deficiency anemia, unspecified iron deficiency anemia type       Plan:   Orders Placed This Encounter  Procedures  . Comprehensive metabolic panel  . Lipid panel  . HIV antibody  . POCT CBC  . POCT Wet + KOH Prep  . POCT urinalysis dipstick  . POCT Microscopic Urinalysis ([facility])    No orders of the defined types were placed in this encounter.    Results for orders placed or performed in visit on 08/12/16  POCT  CBC  Result Value Ref Range   WBC 7.0 4.6 - 10.2 K/uL   Lymph, poc 1.8 0.6 - 3.4   POC LYMPH PERCENT 26.2 10 - 50 %L   MID (cbc) 0.4 0 - 0.9   POC MID % 6.2 0 - 12 %M   POC Granulocyte 4.7 2 - 6.9   Granulocyte percent 67.6 37 - 80 %G   RBC 3.85 (A) 4.04 - 5.48 M/uL   Hemoglobin 10.5 (A) 12.2 - 16.2 g/dL   HCT, POC 32.0 (A) 37.7 - 47.9 %   MCV 83.2 80 - 97 fL   MCH, POC 27.2 27 - 31.2 pg   MCHC 32.7 31.8 -  35.4 g/dL   RDW, POC 15.6 %   Platelet Count, POC 270 142 - 424 K/uL   MPV 7.8 0 - 99.8 fL  POCT Wet + KOH Prep  Result Value Ref Range   Yeast by KOH Absent Absent   Yeast by wet prep Absent Absent   WBC by wet prep Few Few   Clue Cells Wet Prep HPF POC None None   Trich by wet prep Absent Absent   Bacteria Wet Prep HPF POC Many (A) Few   Epithelial Cells By [employer] Pref ([facility]) Few None, Few, Too numerous to count   RBC,UR,HPF,POC None None RBC/hpf  POCT urinalysis dipstick  Result Value Ref Range   Color, UA yellow yellow   Clarity, UA clear clear   Glucose, UA negative negative mg/dL   Bilirubin, UA negative negative   Ketones, POC UA negative negative mg/dL   Spec Grav, UA >=1.030 (A) 1.010 - 1.025   Blood, UA negative negative   pH, UA 5.0 5.0 - 8.0   Protein Ur, POC negative negative mg/dL   Urobilinogen, UA 0.2 0.2 or 1.0 E.U./dL   Nitrite, UA Negative Negative   Leukocytes, UA Negative Negative  POCT Microscopic Urinalysis ([facility])  Result Value Ref Range   WBC,UR,HPF,POC Few (A) None WBC/hpf   RBC,UR,HPF,POC None None RBC/hpf   Bacteria Many (A) None, Too numerous to count   Mucus Absent Absent   Epithelial Cells, UR Per Microscopy Few (A) None, Too numerous to count cells/hpf        Patient Instructions   Physical examination everything looks very good.  The skin lesions appear to be a benign lesion called dermatofibroma, which I do not believe needs any additional concerns care in less you notice them to be growing.  I do not find anything of major concern your abdominal exam. However if you keep having abdominal pains and things are getting no better you should return to consider further evaluation with x-rays or scans or endoscopy procedures.  Since the Rolaids help, you might consider taking an over-the-counter medication like Prilosec (omeprazole) 1 each evening for a month and see if it made a difference.  If your bowels do not move often enough, you might  consider taking some mild over-the-counter laxative like MiraLAX 1 dose when needed.  Avoid insulin AIDS. Continue using Tylenol (acetaminophen) 500 mg 2 tablets up to 3 times daily if needed for pains.  Return annually or as needed.   You have an anemia, probably from iron deficiency. The hemoglobin of 10.7 should be greater than 12.2. Begin taking over-the-counter iron 1 daily for about 3 months and see if you feel better. If you don't, I would suggest she come back and get your blood count rechecked.    IF you received an x-ray today,  you will receive an invoice from [facility]. Please contact [facility] at [phone] with questions or concerns regarding your invoice.   IF you received labwork today, you will receive an invoice from [facility]. Please contact [facility] at [phone] with questions or concerns regarding your invoice.   Our billing staff will not be able to assist you with questions regarding bills from these companies.  You will be contacted with the lab results as soon as they are available. The fastest way to get your results is to activate your My Chart account. Instructions are located on the last page of this paperwork. If you have not heard from [place] regarding the results in 2 weeks, please contact this office.         Return in about 1 year (around 08/12/2017).   HOPPER,DAVID, MD 08/12/2016

## 2016-08-12 NOTE — Patient Instructions (Addendum)
Physical examination everything looks very good.  The skin lesions appear to be a benign lesion called dermatofibroma, which I do not believe needs any additional concerns care in less you notice them to be growing.  I do not find anything of major concern your abdominal exam. However if you keep having abdominal pains and things are getting no better you should return to consider further evaluation with x-rays or scans or endoscopy procedures.  Since the Rolaids help, you might consider taking an over-the-counter medication like Prilosec (omeprazole) 1 each evening for a month and see if it made a difference.  If your bowels do not move often enough, you might consider taking some mild over-the-counter laxative like MiraLAX 1 dose when needed.  Avoid insulin AIDS. Continue using Tylenol (acetaminophen) 500 mg 2 tablets up to 3 times daily if needed for pains.  Return annually or as needed.   You have an anemia, probably from iron deficiency. The hemoglobin of 10.7 should be greater than 12.2. Begin taking over-the-counter iron 1 daily for about 3 months and see if you feel better. If you don't, I would suggest she come back and get your blood count rechecked.    IF you received an x-ray today, you will receive an invoice from [facility]. Please contact [facility] at [phone] with questions or concerns regarding your invoice.   IF you received labwork today, you will receive an invoice from [facility]. Please contact [facility] at [phone] with questions or concerns regarding your invoice.   Our billing staff will not be able to assist you with questions regarding bills from these companies.  You will be contacted with the lab results as soon as they are available. The fastest way to get your results is to activate your My Chart account. Instructions are located on the last page of this paperwork. If you have not heard from [place] regarding the results in 2 weeks, please  contact this office.

## 2016-08-13 LAB — BASIC METABOLIC PANEL
BUN: 8 (ref 4–21)
Creatinine: 0.9 (ref 0.5–1.1)
Glucose: 80
Potassium: 4 (ref 3.4–5.3)
Sodium: 138 (ref 137–147)

## 2016-08-13 LAB — COMPREHENSIVE METABOLIC PANEL
ALT: 13 IU/L (ref 0–32)
AST: 19 IU/L (ref 0–40)
Albumin/Globulin Ratio: 1.4 (ref 1.2–2.2)
Albumin: 4.5 g/dL (ref 3.5–5.5)
Alkaline Phosphatase: 39 IU/L (ref 39–117)
BUN/Creatinine Ratio: 9 (ref 9–23)
BUN: 8 mg/dL (ref 6–20)
Bilirubin Total: 0.9 mg/dL (ref 0.0–1.2)
CALCIUM: 8.9 mg/dL (ref 8.7–10.2)
CO2: 22 mmol/L (ref 18–29)
CREATININE: 0.85 mg/dL (ref 0.57–1.00)
Chloride: 101 mmol/L (ref 96–106)
GFR calc Af Amer: 106 mL/min/{1.73_m2} (ref >59)
GFR calc non Af Amer: 92 mL/min/{1.73_m2} (ref >59)
Globulin, Total: 3.2 g/dL (ref 1.5–4.5)
Glucose: 80 mg/dL (ref 65–99)
Potassium: 4 mmol/L (ref 3.5–5.2)
Sodium: 138 mmol/L (ref 134–144)
Total Protein: 7.7 g/dL (ref 6.0–8.5)

## 2016-08-13 LAB — LIPID PANEL
CHOL/HDL RATIO: 2.1 ratio (ref 0.0–4.4)
Cholesterol, Total: 165 mg/dL (ref 100–199)
Cholesterol: 165 (ref 0–200)
HDL: 78 mg/dL (ref >39)
HDL: 78 — AB (ref 35–70)
LDL CALC: 77 mg/dL (ref 0–99)
LDL Cholesterol: 77
TRIGLYCERIDES: 49 mg/dL (ref 0–149)
Triglycerides: 49 (ref 40–160)
VLDL Cholesterol Cal: 10 mg/dL (ref 5–40)

## 2016-08-13 LAB — HEPATIC FUNCTION PANEL
ALT: 13 (ref 7–35)
AST: 19 (ref 13–35)
Alkaline Phosphatase: 39 (ref 25–125)
Bilirubin, Total: 0.9

## 2016-08-13 LAB — HM PAP SMEAR

## 2016-08-13 LAB — FERRITIN: Ferritin: 5 ng/mL — ABNORMAL LOW (ref 15–150)

## 2016-08-13 LAB — HIV ANTIBODY (ROUTINE TESTING W REFLEX): HIV Screen 4th Generation wRfx: NONREACTIVE

## 2016-08-19 LAB — PAP IG, CT-NG, RFX HPV ASCU
Chlamydia, Nuc. Acid Amp: NEGATIVE
GONOCOCCUS BY NUCLEIC ACID AMP: NEGATIVE
PAP SMEAR COMMENT: 0

## 2016-08-19 LAB — HPV DNA PROBE HIGH RISK, AMPLIFIED: HPV, HIGH-RISK: NEGATIVE

## 2016-08-31 ENCOUNTER — Encounter: Payer: Self-pay | Admitting: Radiology

## 2016-09-16 ENCOUNTER — Telehealth: Payer: Self-pay | Admitting: General Practice

## 2016-09-16 NOTE — Telephone Encounter (Signed)
Pt is looking for lab results  Best number is [phone]

## 2016-09-17 NOTE — Telephone Encounter (Signed)
Pt given lab results 

## 2017-07-05 ENCOUNTER — Encounter: Payer: Self-pay | Admitting: Physician Assistant

## 2018-06-11 ENCOUNTER — Encounter (HOSPITAL_COMMUNITY): Payer: Self-pay

## 2018-06-11 ENCOUNTER — Ambulatory Visit (HOSPITAL_COMMUNITY)
Admission: EM | Admit: 2018-06-11 | Discharge: 2018-06-11 | Disposition: A | Discharge status: Home / Self Care | Payer: 59 | Attending: Family Medicine | Admitting: Family Medicine

## 2018-06-11 DIAGNOSIS — J40 Bronchitis, not specified as acute or chronic: Secondary | ICD-10-CM

## 2018-06-11 MED ORDER — PREDNISONE 20 MG PO TABS: ORAL_TABLET | ORAL | 0 refills | Status: DC

## 2018-06-11 MED ORDER — AZITHROMYCIN 250 MG PO TABS: 250.0000 mg | ORAL_TABLET | Freq: Every day | ORAL | 0 refills | Status: DC

## 2018-06-11 NOTE — ED Provider Notes (Signed)
[place]    CSN: [id number] Arrival date & time: 06/11/18  1230     History   Chief Complaint Chief Complaint  Patient presents with  . Cough  . Congestion    HPI Nancy Mcmahon is a 33 y.o. female.   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  She has had a cough for a week that began with sore throat and fever.  The cough persists.  Patient is a non-smoker and has never had asthma.  She does feel tight in her chest.  Cough is keeping her awake at night.     Past Medical History:  Diagnosis Date  . Allergy     Patient Active Problem List   Diagnosis Date Noted  . Iron deficiency anemia 08/12/2016  . Lower abdominal pain 08/12/2016  . Preventative health care 04/17/2011    Past Surgical History:  Procedure Laterality Date  . TUBAL LIGATION      OB History   No obstetric history on file.      Home Medications    Prior to Admission medications   Medication Sig Start Date End Date Taking? Authorizing Provider  azithromycin (ZITHROMAX) 250 MG tablet Take 1 tablet (250 mg total) by mouth daily. Take first 2 tablets together, then 1 every day until finished. 06/11/18   Robyn Haber, MD  predniSONE (DELTASONE) 20 MG tablet Two daily with food 06/11/18   Robyn Haber, MD    Family History Family History  Problem Relation Age of Onset  . Seizures Mother   . Cirrhosis Father   . Cancer Maternal Grandfather   . Crohn's disease Sister     Social History Social History   Tobacco Use  . Smoking status: Never Smoker  . Smokeless tobacco: Never Used  Substance Use Topics  . Alcohol use: No    Alcohol/week: 0.0 standard drinks  . Drug use: No     Allergies   Banana; Fish allergy; Peanut-containing drug products; Shellfish allergy; Strawberry flavor; Watermelon flavor; and Penicillins   Review of Systems Review of Systems   Physical Exam Triage Vital Signs ED Triage Vitals  Enc Vitals Group     BP 06/11/18 1346  123/79     Pulse Rate 06/11/18 1346 80     Resp 06/11/18 1346 18     Temp 06/11/18 1346 98.8 F (37.1 C)     Temp Source 06/11/18 1346 Oral     SpO2 06/11/18 1346 100 %     Weight --      Height --      Head Circumference --      Peak Flow --      Pain Score 06/11/18 1347 4     Pain Loc --      Pain Edu? --      Excl. in [place]? --    No data found.  Updated Vital Signs BP 123/79 (BP Location: Right Arm)   Pulse 80   Temp 98.8 F (37.1 C) (Oral)   Resp 18   LMP 06/10/2018   SpO2 100%    Physical Exam Vitals signs and nursing note reviewed.  Constitutional:      Appearance: Normal appearance.  HENT:     Head: Normocephalic.     Mouth/Throat:     Mouth: Mucous membranes are moist.  Eyes:     Conjunctiva/sclera: Conjunctivae normal.     Pupils: Pupils are equal, round, and reactive to light.  Neck:  Musculoskeletal: Normal range of motion and neck supple.  Cardiovascular:     Rate and Rhythm: Normal rate and regular rhythm.  Pulmonary:     Effort: Pulmonary effort is normal.     Breath sounds: Rhonchi present.  Musculoskeletal: Normal range of motion.  Skin:    Coloration: Skin is pale.  Neurological:     General: No focal deficit present.     Mental Status: She is alert.  Psychiatric:        Mood and Affect: Mood normal.      UC Treatments / Results  Labs (all labs ordered are listed, but only abnormal results are displayed) Labs Reviewed - No data to display  EKG None  Radiology No results found.  Procedures Procedures (including critical care time)  Medications Ordered in UC Medications - No data to display  Initial Impression / Assessment and Plan / UC Course  I have reviewed the triage vital signs and the nursing notes.  Pertinent labs & imaging results that were available during my care of the patient were reviewed by me and considered in my medical decision making (see chart for details).    Final Clinical Impressions(s) / UC  Diagnoses   Final diagnoses:  Bronchitis     Discharge Instructions     Schedule an appointment with Dr. Benjie Karvonen for the anemia.    ED Prescriptions    Medication Sig Dispense Auth. Provider   predniSONE (DELTASONE) 20 MG tablet Two daily with food 10 tablet Robyn Haber, MD   azithromycin (ZITHROMAX) 250 MG tablet Take 1 tablet (250 mg total) by mouth daily. Take first 2 tablets together, then 1 every day until finished. 6 tablet Robyn Haber, MD     Controlled Substance Prescriptions [place] Controlled Substance Registry consulted? Not Applicable   Robyn Haber, MD 06/11/18 1409

## 2018-06-11 NOTE — Discharge Instructions (Signed)
Schedule an appointment with Dr. Benjie Karvonen for the anemia.

## 2018-06-11 NOTE — ED Triage Notes (Signed)
Pt presents with non productive cough and chest congestion for about a week.

## 2018-06-12 ENCOUNTER — Telehealth: Payer: Self-pay

## 2018-06-12 NOTE — Telephone Encounter (Signed)
Pt called to clinic.  States she was dx with bronchitis at urgent care yesterday.  Is expected to RTW 03/11 but does not feel she will be ready.  Wants appt 03/10 to see if she should stay out of work.  Next available appt 03/13.  Pt declined.

## 2018-06-16 ENCOUNTER — Other Ambulatory Visit: Payer: Self-pay | Admitting: Family Medicine

## 2018-06-16 ENCOUNTER — Ambulatory Visit (INDEPENDENT_AMBULATORY_CARE_PROVIDER_SITE_OTHER): Payer: 59 | Admitting: Family Medicine

## 2018-06-16 ENCOUNTER — Telehealth: Payer: Self-pay

## 2018-06-16 ENCOUNTER — Other Ambulatory Visit: Payer: Self-pay

## 2018-06-16 ENCOUNTER — Encounter: Payer: Self-pay | Admitting: Family Medicine

## 2018-06-16 VITALS — BP 118/66 | HR 86 | Temp 98.1°F | Ht 61.0 in | Wt 129.6 lb

## 2018-06-16 DIAGNOSIS — D500 Iron deficiency anemia secondary to blood loss (chronic): Secondary | ICD-10-CM

## 2018-06-16 DIAGNOSIS — T7840XA Allergy, unspecified, initial encounter: Secondary | ICD-10-CM

## 2018-06-16 DIAGNOSIS — R5383 Other fatigue: Secondary | ICD-10-CM

## 2018-06-16 DIAGNOSIS — D509 Iron deficiency anemia, unspecified: Secondary | ICD-10-CM

## 2018-06-16 LAB — FOLATE: Folate: 11.7 ng/mL (ref >5.9)

## 2018-06-16 LAB — CBC WITH DIFFERENTIAL/PLATELET
Basophils Absolute: 0 10*3/uL (ref 0.0–0.1)
Basophils Relative: 0.4 % (ref 0.0–3.0)
Eosinophils Absolute: 0 10*3/uL (ref 0.0–0.7)
Eosinophils Relative: 0.5 % (ref 0.0–5.0)
HCT: 25.2 % — ABNORMAL LOW (ref 36.0–46.0)
Hemoglobin: 7.9 g/dL — CL (ref 12.0–15.0)
LYMPHS ABS: 2.1 10*3/uL (ref 0.7–4.0)
Lymphocytes Relative: 22.4 % (ref 12.0–46.0)
MCHC: 32.1 g/dL (ref 30.0–36.0)
MCV: 72.5 fl — ABNORMAL LOW (ref 78.0–100.0)
Monocytes Absolute: 0.6 10*3/uL (ref 0.1–1.0)
Monocytes Relative: 6.3 % (ref 3.0–12.0)
Neutro Abs: 6.6 10*3/uL (ref 1.4–7.7)
Neutrophils Relative %: 70.4 % (ref 43.0–77.0)
Platelets: 361 10*3/uL (ref 150.0–400.0)
RBC: 3.4 Mil/uL — ABNORMAL LOW (ref 3.87–5.11)
RDW: 17.6 % — ABNORMAL HIGH (ref 11.5–15.5)
WBC: 9.3 10*3/uL (ref 4.0–10.5)

## 2018-06-16 LAB — COMPREHENSIVE METABOLIC PANEL
ALK PHOS: 36 U/L — AB (ref 39–117)
ALT: 51 U/L — ABNORMAL HIGH (ref 0–35)
AST: 31 U/L (ref 0–37)
Albumin: 4 g/dL (ref 3.5–5.2)
BUN: 14 mg/dL (ref 6–23)
CO2: 27 mEq/L (ref 19–32)
Calcium: 8.7 mg/dL (ref 8.4–10.5)
Chloride: 103 mEq/L (ref 96–112)
Creatinine, Ser: 0.91 mg/dL (ref 0.40–1.20)
GFR: 86.21 mL/min (ref >60.00)
Glucose, Bld: 66 mg/dL — ABNORMAL LOW (ref 70–99)
Potassium: 3.5 mEq/L (ref 3.5–5.1)
Sodium: 140 mEq/L (ref 135–145)
Total Bilirubin: 0.8 mg/dL (ref 0.2–1.2)
Total Protein: 7.1 g/dL (ref 6.0–8.3)

## 2018-06-16 LAB — IBC + FERRITIN
Ferritin: 2.2 ng/mL — ABNORMAL LOW (ref 10.0–291.0)
Iron: 23 ug/dL — ABNORMAL LOW (ref 42–145)
Saturation Ratios: 4.5 % — ABNORMAL LOW (ref 20.0–50.0)
Transferrin: 366 mg/dL — ABNORMAL HIGH (ref 212.0–360.0)

## 2018-06-16 LAB — VITAMIN B12: VITAMIN B 12: 255 pg/mL (ref 211–911)

## 2018-06-16 LAB — TSH: TSH: 0.83 u[IU]/mL (ref 0.35–4.50)

## 2018-06-16 MED ORDER — EPINEPHRINE 0.3 MG/0.3ML IJ SOAJ: 0.3000 mg | INTRAMUSCULAR | 5 refills | Status: DC | PRN

## 2018-06-16 MED ORDER — FERROUS SULFATE 325 (65 FE) MG PO TABS: 325.0000 mg | ORAL_TABLET | Freq: Every day | ORAL | 1 refills | Status: DC

## 2018-06-16 NOTE — Addendum Note (Signed)
Addended by: Elmer Picker on: 06/16/2018 11:59 AM   Modules accepted: Orders

## 2018-06-16 NOTE — Progress Notes (Signed)
Nancy Mcmahon DOB: 08-09-1985 Encounter date: 06/16/2018  This isa 33 y.o. female who presents to establish care. Chief Complaint  Patient presents with  . Establish Care    Pt is here to establish care     History of present illness: Hasn't had regular primary doc in years. Started working part time at [employer]. Just got degree in health and human services.   Asthma - had as child. Hasn't dealt with this in adult life. Did get treated for recent resp infection and is doing better at this point.   Allergies/hay fever: would like to see a specialist for this. Watermelon, strawberry, banana, shellfish, regular fish, outdoor allergies. Feels like she is getting more allergies. Hasn't had allergy testing done in about 8-9 years. Has a hard time determining what she can safely eat. Itching, worse at night.   Last pap was 3 years ago.   Anemia: for about 8-9 years. Almost needed blood transfusion with last daughter. Very tired all the time. "living off of caffeine". Feels that periods are long. Last period was 8th-12th. Come every 3 weeks. Does sometimes soak through; esp first 2 days. Uses super plus tampons. Changes every few hours. Will soak through at 2 hours sometimes. Periods have been this way as long as she can remember. Only took iron supplement for about a month.    Past Medical History:  Diagnosis Date  . Allergy   . Anemia   . Childhood asthma    Past Surgical History:  Procedure Laterality Date  . TUBAL LIGATION     Allergies  Allergen Reactions  . Banana   . Fish Allergy   . Peanut-Containing Drug Products   . Shellfish Allergy   . Strawberry Flavor   . Watermelon Flavor   . Wheat Bran   . Penicillins Rash   No outpatient medications have been marked as taking for the 06/16/18 encounter (Office Visit) with [name], MD.   Social History   Tobacco Use  . Smoking status: Never Smoker  . Smokeless tobacco: Never Used  Substance Use Topics  . Alcohol  use: No    Alcohol/week: 0.0 standard drinks   Family History  Problem Relation Age of Onset  . Seizures Mother   . Cirrhosis Father   . Crohn's disease Son   . Cancer Maternal Grandfather        stomach     Review of Systems  Constitutional: Positive for fatigue. Negative for chills and fever.  HENT: Positive for congestion (improving).   Respiratory: Positive for cough. Negative for chest tightness, shortness of breath (improving) and wheezing.   Cardiovascular: Negative for chest pain, palpitations and leg swelling.  Gastrointestinal: Negative for constipation, diarrhea and vomiting.    Objective:  BP 118/66 (BP Location: Right Arm, Patient Position: Sitting, Cuff Size: Normal)   Pulse 86   Temp 98.1 F (36.7 C) (Oral)   Ht 5\' 1"  (1.549 m)   Wt 129 lb 9.6 oz (58.8 kg)   LMP 06/10/2018   BMI 24.49 kg/m   Weight: 129 lb 9.6 oz (58.8 kg)   BP Readings from Last 3 Encounters:  06/16/18 118/66  06/11/18 123/79  08/12/16 113/72   Wt Readings from Last 3 Encounters:  06/16/18 129 lb 9.6 oz (58.8 kg)  08/12/16 122 lb 9.6 oz (55.6 kg)  04/06/16 121 lb (54.9 kg)    Physical Exam Constitutional:      General: She is not in acute distress.    Appearance: She  is well-developed.  Cardiovascular:     Rate and Rhythm: Normal rate and regular rhythm.     Heart sounds: Normal heart sounds. No murmur. No friction rub.  Pulmonary:     Effort: Pulmonary effort is normal. No respiratory distress.     Breath sounds: Normal breath sounds. No wheezing or rales.  Abdominal:     General: Abdomen is flat. Bowel sounds are normal. There is no distension.     Palpations: There is no mass.     Tenderness: There is no abdominal tenderness.  Musculoskeletal:     Right lower leg: No edema.     Left lower leg: No edema.  Skin:    Coloration: Skin is pale.  Neurological:     Mental Status: She is alert and oriented to person, place, and time.  Psychiatric:        Mood and Affect:  Mood normal.        Behavior: Behavior normal.     Assessment/Plan: 1. Allergic state, initial encounter Stressed importance of carrying epi pen with her.  - Ambulatory referral to Allergy - EPINEPHrine 0.3 mg/0.3 mL IJ SOAJ injection; Inject 0.3 mLs (0.3 mg total) into the muscle as needed for anaphylaxis.  Dispense: 2 Device; Refill: 5  2. Iron deficiency anemia, unspecified iron deficiency anemia type Discussed high iron diet, taking supplement a few days/week.  - CBC with Differential/Platelet; Future - Folate; Future - IBC + Ferritin; Future  3. Fatigue, unspecified type Start with bloodwork; follow up pending results. - Comprehensive metabolic panel; Future - CBC with Differential/Platelet; Future - TSH; Future - Vitamin B12; Future  Return pending bloodwork.  Micheline Rough, MD

## 2018-06-16 NOTE — Telephone Encounter (Signed)
I figured she would be very low. This has been ongoing issue for her in past. Please just let her know. If she feels light headed or dizzy or palpitations she needs to be seen; but we reviewed all those symptoms today and she has been stable for some time.    I will go ahead and put in hematology referral for her.We discussed this was possibility as well today.

## 2018-06-16 NOTE — Telephone Encounter (Signed)
[facility] called with critical result  HGB 7.9 HCT 25.2   Dr. Ethlyn Gallery advised.

## 2018-06-16 NOTE — Telephone Encounter (Signed)
Spoke with patient. She expressed understanding.

## 2018-06-19 NOTE — Progress Notes (Signed)
Noted  

## 2018-07-10 ENCOUNTER — Telehealth: Payer: Self-pay

## 2018-07-10 ENCOUNTER — Encounter: Payer: Self-pay | Admitting: Family Medicine

## 2018-07-10 NOTE — Telephone Encounter (Signed)
-----   Message from [name], MD sent at 07/07/2018  4:46 PM EDT ----- Please check in with her. I see that hematology visit was cancelled due to the [facility] virus, but I am worried that hemoglobin levels were so low when we last checked.   Make sure she has been tolerating the iron supplement?   Has she given more consideration to period control/possible ocp?  I think we should at very least recheck cbc in next week to make sure we have seen some improvement before she has next menses.   PLEASE PUT IN ORDER IF SHE IS IN AGREEMENT (cbc only). Please also remind her that if she feels dizzy, light headed, SOB she needs immediate evaluation due to low hemoglobin. Hopefully since last period and with iron she has had time to build stores.   OK to copy this and paste in telephone template.

## 2018-07-10 NOTE — Telephone Encounter (Signed)
Iron was making her stomach cramp, she has not been taking it.  Patient is still unsure of OCP.  Patient has refused lab visit at this.

## 2018-07-11 NOTE — Telephone Encounter (Signed)
I do worry about her severe anemia as this can put strain on heart. Some ideas while we await her being able to see hematology:  1. Please mail her iron rich diet info. She may find some iron rich foods that she will tolerate better than the supplement.  2. She can try a lower dose of iron supplement. Options are liquid iron (which could be dosed quite low) or even just doing a lower dose tablet or prenatal with iron or flinstones with iron. Her body is very iron deficient, so even a low dose every other day would help her body out.   Please have her see what she is able to do/tolerate above and then give [place] an update in a couple of weeks.

## 2018-07-11 NOTE — Telephone Encounter (Signed)
Spoke with the patient. She expressed understanding. Info has been mailed.

## 2018-08-09 ENCOUNTER — Ambulatory Visit: Payer: BC Managed Care – PPO | Admitting: Allergy

## 2018-08-09 NOTE — Progress Notes (Deleted)
New Patient Note  RE: Nancy Mcmahon MRN: [id number] DOB: 14-Apr-1985 Date of Office Visit: 08/09/2018  Referring provider: [name], MD Primary care provider: [name], MD  Chief Complaint: No chief complaint on file.  History of Present Illness: I had the pleasure of seeing Jamieson Lisa for initial evaluation at the [facility] on 08/09/2018. She is a 33 y.o. female, who is referred here by [name], MD for the evaluation of environmental and food allergies. She is accompanied today by her *** who provided/contributed to the history.   Rhinitis: She reports symptoms of ***. Symptoms have been going on for *** years. The symptoms are present *** all year around with worsening in ***. Other triggers include exposure to ***. Anosmia: ***. Headache: ***. No additional trigger factors including exposure to *** strong odors(perfumes/cleaning agents), change in temperature, spicy food and emotional situations. She has used *** with ***fair improvement in symptoms. Sinus infections: ***. Previous work up includes: ***. Previous ENT evaluation: ***. Previous sinus imaging: ***.  Food: She reports food allergy to ***. The reaction occurred at the age of ***, after she ate *** amount of ***. Symptoms started within *** and was in the form of *** hives, swelling, wheezing, abdominal pain, diarrhea, vomiting. ***Denies any associated cofactors such as exertion, infection, NSAID use, or alcohol consumption. The symptoms lasted for ***. She was evaluated in ED and received ***. Since this episode, she does *** not report other accidental exposures to ***. She does *** not have access to epinephrine autoinjector and *** needed to use it.   Past work up includes: immunocap which showed *** and skin prick testing which showed ***.  Dietary History: patient has been eating other foods including ***milk, ***eggs, ***peanut, ***treenuts, ***sesame,  ***shellfish, ***seafood, ***soy, ***wheat, ***meats, ***fruits and ***vegetables.  She reports reading labels and avoiding *** in diet completely. She tolerates ***baked egg and baked milk products.   Assessment and Plan: Marygrace is a 33 y.o. female with: No problem-specific Assessment & Plan notes found for this encounter.  No follow-ups on file.  No orders of the defined types were placed in this encounter.  Lab Orders  No laboratory test(s) ordered today    Other allergy screening: Asthma: {Blank single:19197::"yes","no"} Rhino conjunctivitis: {Blank single:19197::"yes","no"} Food allergy: {Blank single:19197::"yes","no"} Medication allergy: {Blank single:19197::"yes","no"} Hymenoptera allergy: {Blank single:19197::"yes","no"} Urticaria: {Blank single:19197::"yes","no"} Eczema:{Blank single:19197::"yes","no"} History of recurrent infections suggestive of immunodeficency: {Blank single:19197::"yes","no"}  Diagnostics: Spirometry:  Tracings reviewed. Her effort: {Blank single:19197::"Good reproducible efforts.","It was hard to get consistent efforts and there is a question as to whether this reflects a maximal maneuver.","Poor effort, data can not be interpreted."} FVC: ***L FEV1: ***L, ***% predicted FEV1/FVC ratio: ***% Interpretation: {Blank single:19197::"Spirometry consistent with mild obstructive disease","Spirometry consistent with moderate obstructive disease","Spirometry consistent with severe obstructive disease","Spirometry consistent with possible restrictive disease","Spirometry consistent with mixed obstructive and restrictive disease","Spirometry uninterpretable due to technique","Spirometry consistent with normal pattern","No overt abnormalities noted given today's efforts"}.  Please see scanned spirometry results for details.  Skin Testing: {Blank single:19197::"Select foods","Environmental allergy panel","Environmental allergy panel and select foods","Food  allergy panel","None","Deferred due to recent antihistamines use"}. Positive test to: ***. Negative test to: ***.  Results discussed with patient/family.   Past Medical History: Patient Active Problem List   Diagnosis Date Noted  . Fatigue 06/16/2018  . Iron deficiency anemia 08/12/2016  . Lower abdominal pain 08/12/2016  . Preventative health care 04/17/2011   Past Medical History:  Diagnosis  Date  . Allergy   . Anemia   . Childhood asthma    Past Surgical History: Past Surgical History:  Procedure Laterality Date  . TUBAL LIGATION     Medication List:  Current Outpatient Medications  Medication Sig Dispense Refill  . EPINEPHrine 0.3 mg/0.3 mL IJ SOAJ injection Inject 0.3 mLs (0.3 mg total) into the muscle as needed for anaphylaxis. 2 Device 5  . ferrous sulfate 325 (65 FE) MG tablet Take 1 tablet (325 mg total) by mouth daily with breakfast. 90 tablet 1   No current facility-administered medications for this visit.    Allergies: Allergies  Allergen Reactions  . Banana   . Fish Allergy   . Peanut-Containing Drug Products   . Shellfish Allergy   . Strawberry Flavor   . Watermelon Flavor   . Wheat Bran   . Penicillins Rash   Social History: Social History   Socioeconomic History  . Marital status: Married    Spouse name: Not on file  . Number of children: Not on file  . Years of education: Not on file  . Highest education level: Not on file  Occupational History  . Not on file  Social Needs  . Financial resource strain: Not on file  . Food insecurity:    Worry: Not on file    Inability: Not on file  . Transportation needs:    Medical: Not on file    Non-medical: Not on file  Tobacco Use  . Smoking status: Never Smoker  . Smokeless tobacco: Never Used  Substance and Sexual Activity  . Alcohol use: No    Alcohol/week: 0.0 standard drinks  . Drug use: No  . Sexual activity: Yes  Lifestyle  . Physical activity:    Days per week: Not on file     Minutes per session: Not on file  . Stress: Not on file  Relationships  . Social connections:    Talks on phone: Not on file    Gets together: Not on file    Attends religious service: Not on file    Active member of club or organization: Not on file    Attends meetings of clubs or organizations: Not on file    Relationship status: Not on file  Other Topics Concern  . Not on file  Social History Narrative  . Not on file   Lives in a ***. Smoking: *** Occupation: ***  Environmental HistoryFreight forwarder in the house: Estate agent in the family room: {Blank single:19197::"yes","no"} Carpet in the bedroom: {Blank single:19197::"yes","no"} Heating: {Blank single:19197::"electric","gas"} Cooling: {Blank single:19197::"central","window"} Pet: {Blank single:19197::"yes ***","no"}  Family History: Family History  Problem Relation Age of Onset  . Seizures Mother   . Cirrhosis Father   . Crohn's disease Son   . Cancer Maternal Grandfather        stomach   Problem                               Relation Asthma                                   *** Eczema                                *** Food allergy                          ***  Allergic rhino conjunctivitis     ***  Review of Systems  Constitutional: Negative for appetite change, chills, fever and unexpected weight change.  HENT: Negative for congestion and rhinorrhea.   Eyes: Negative for itching.  Respiratory: Negative for cough, chest tightness, shortness of breath and wheezing.   Cardiovascular: Negative for chest pain.  Gastrointestinal: Negative for abdominal pain.  Genitourinary: Negative for difficulty urinating.  Skin: Negative for rash.  Allergic/Immunologic: Negative for environmental allergies and food allergies.  Neurological: Negative for headaches.   Objective: There were no vitals taken for this visit. There is no height or weight on file to calculate BMI. Physical Exam   Constitutional: She is oriented to person, place, and time. She appears well-developed and well-nourished.  HENT:  Head: Normocephalic and atraumatic.  Right Ear: External ear normal.  Left Ear: External ear normal.  Nose: Nose normal.  Mouth/Throat: Oropharynx is clear and moist.  Eyes: Conjunctivae and EOM are normal.  Neck: Neck supple.  Cardiovascular: Normal rate, regular rhythm and normal heart sounds. Exam reveals no gallop and no friction rub.  No murmur heard. Pulmonary/Chest: Effort normal and breath sounds normal. She has no wheezes. She has no rales.  Abdominal: Soft.  Neurological: She is alert and oriented to person, place, and time.  Skin: Skin is warm. No rash noted.  Psychiatric: She has a normal mood and affect. Her behavior is normal.  Nursing note and vitals reviewed.  The plan was reviewed with the patient/family, and all questions/concerned were addressed.  It was my pleasure to see Jaylia today and participate in her care. Please feel free to contact me with any questions or concerns.  Sincerely,  Rexene Alberts, DO Allergy & Immunology  [facility] office: [phone] [facility] office: [phone]

## 2019-08-09 ENCOUNTER — Other Ambulatory Visit: Payer: Self-pay

## 2019-08-10 ENCOUNTER — Ambulatory Visit (INDEPENDENT_AMBULATORY_CARE_PROVIDER_SITE_OTHER): Payer: Self-pay | Admitting: Adult Health

## 2019-08-10 ENCOUNTER — Encounter: Payer: Self-pay | Admitting: Adult Health

## 2019-08-10 ENCOUNTER — Telehealth: Payer: Self-pay | Admitting: Family Medicine

## 2019-08-10 VITALS — BP 110/70 | Temp 97.9°F | Wt 136.0 lb

## 2019-08-10 DIAGNOSIS — J302 Other seasonal allergic rhinitis: Secondary | ICD-10-CM

## 2019-08-10 DIAGNOSIS — L409 Psoriasis, unspecified: Secondary | ICD-10-CM

## 2019-08-10 MED ORDER — PREDNISONE 10 MG PO TABS: 10.0000 mg | ORAL_TABLET | Freq: Every day | ORAL | 0 refills | Status: DC

## 2019-08-10 MED ORDER — LEVOCETIRIZINE DIHYDROCHLORIDE 5 MG PO TABS: 5.0000 mg | ORAL_TABLET | Freq: Every evening | ORAL | 2 refills | Status: DC

## 2019-08-10 MED ORDER — COAL TAR EXTRACT 2 % EX SHAM: MEDICATED_SHAMPOO | CUTANEOUS | 0 refills | Status: DC

## 2019-08-10 NOTE — Telephone Encounter (Signed)
Prescriptions cancelled and sent to the correct pharmacy.  Nothing further needed.

## 2019-08-10 NOTE — Telephone Encounter (Signed)
Pt stated the [facility] did not receive a request to refill for the pt.   Medication: Xyzal Coal tar shampoo  Pharmacy: [facility]: [phone]

## 2019-08-10 NOTE — Patient Instructions (Signed)
It was great meeting you today   I have sent in a short course of prednisone and a prescription for xyzal for your allergy symptoms  I have also send in a coal tar shampoo for the dandruff.

## 2019-08-10 NOTE — Progress Notes (Signed)
Subjective:    Patient ID: Nancy Mcmahon, female    DOB: 1985/12/19, 34 y.o.   MRN: [id number]  HPI  34 year old female who  has a past medical history of Allergy, Anemia, and Childhood asthma.   She presents to the office today for 2 separate acute issues.  First complaint is that of seasonal allergy-like symptoms.  Her symptoms include a dry cough, rhinorrhea, nasal congestion, and itchy watery eyes.  She has tried various over-the-counter medications such as Flonase, Zyrtec and drugstore brand allergy medicine.  She has gotten very little relief with her symptoms.  She denies fevers, chills, chest pain, or shortness of breath  Additionally she also is complaining of a dry flaky scalp this is been an ongoing issue and she tried a home remedy of tea tree oil, unfortunately she did not dilute the tea tree oil when she placed it on her scalp and this caused worsening problems.  In the meantime she has tried head and shoulders without relief.   Review of Systems See HPI   Past Medical History:  Diagnosis Date  . Allergy   . Anemia   . Childhood asthma     Social History   Socioeconomic History  . Marital status: Married    Spouse name: Not on file  . Number of children: Not on file  . Years of education: Not on file  . Highest education level: Not on file  Occupational History  . Not on file  Tobacco Use  . Smoking status: Never Smoker  . Smokeless tobacco: Never Used  Substance and Sexual Activity  . Alcohol use: No    Alcohol/week: 0.0 standard drinks  . Drug use: No  . Sexual activity: Yes  Other Topics Concern  . Not on file  Social History Narrative  . Not on file   Social Determinants of Health   Financial Resource Strain:   . Difficulty of Paying Living Expenses:   Food Insecurity:   . Worried About Charity fundraiser in the Last Year:   . Arboriculturist in the Last Year:   Transportation Needs:   . Film/video editor (Medical):   Marland Kitchen Lack of  Transportation (Non-Medical):   Physical Activity:   . Days of Exercise per Week:   . Minutes of Exercise per Session:   Stress:   . Feeling of Stress :   Social Connections:   . Frequency of Communication with Friends and Family:   . Frequency of Social Gatherings with Friends and Family:   . Attends Religious Services:   . Active Member of Clubs or Organizations:   . Attends Archivist Meetings:   Marland Kitchen Marital Status:   Intimate Partner Violence:   . Fear of Current or Ex-Partner:   . Emotionally Abused:   Marland Kitchen Physically Abused:   . Sexually Abused:     Past Surgical History:  Procedure Laterality Date  . TUBAL LIGATION      Family History  Problem Relation Age of Onset  . Seizures Mother   . Cirrhosis Father   . Crohn's disease Son   . Cancer Maternal Grandfather        stomach    Allergies  Allergen Reactions  . Banana   . Fish Allergy   . Peanut-Containing Drug Products   . Shellfish Allergy   . Strawberry Flavor   . Watermelon Flavor   . Wheat Bran   . Penicillins Rash  Current Outpatient Medications on File Prior to Visit  Medication Sig Dispense Refill  . EPINEPHrine 0.3 mg/0.3 mL IJ SOAJ injection Inject 0.3 mLs (0.3 mg total) into the muscle as needed for anaphylaxis. 2 Device 5  . ferrous sulfate 325 (65 FE) MG tablet Take 1 tablet (325 mg total) by mouth daily with breakfast. 90 tablet 1   No current facility-administered medications on file prior to visit.    There were no vitals taken for this visit.      Objective:   Physical Exam Vitals and nursing note reviewed.  Constitutional:      Appearance: Normal appearance.  HENT:     Head: Normocephalic.     Right Ear: Ear canal and external ear normal. A middle ear effusion is present. There is no impacted cerumen.     Left Ear: Ear canal and external ear normal. A middle ear effusion is present. There is no impacted cerumen.     Nose: Mucosal edema and rhinorrhea present. Rhinorrhea  is clear.     Right Turbinates: Enlarged and swollen.     Left Turbinates: Enlarged.  Cardiovascular:     Rate and Rhythm: Normal rate and regular rhythm.     Pulses: Normal pulses.     Heart sounds: Normal heart sounds.  Pulmonary:     Effort: Pulmonary effort is normal.     Breath sounds: Normal breath sounds.  Skin:    General: Skin is warm and dry.     Comments: She does have significant scalp psoriasis throughout  Neurological:     General: No focal deficit present.     Mental Status: She is alert and oriented to person, place, and time.  Psychiatric:        Mood and Affect: Mood normal.        Behavior: Behavior normal.        Thought Content: Thought content normal.        Judgment: Judgment normal.        Assessment & Plan:  1. Seasonal allergies  - levocetirizine (XYZAL) 5 MG tablet; Take 1 tablet (5 mg total) by mouth every evening.  Dispense: 30 tablet; Refill: 2 - predniSONE (DELTASONE) 10 MG tablet; Take 1 tablet (10 mg total) by mouth daily with breakfast.  Dispense: 5 tablet; Refill: 0  2. Scalp psoriasis -Prescribe a 2% coal tar extract to use 3 times a week.  She was advised if no improvement in her symptoms and follow-up with PCP in 2 to 3 weeks and she may need a referral to dermatology.  Advised against using tea tree oil on the skin - Coal Tar Extract 2 % SHAM; Use 3 times a week  Dispense: 236 mL; Refill: 0   Dorothyann Peng, NP

## 2019-08-10 NOTE — Telephone Encounter (Signed)
Pt would like an RX for prednisone and a prescription for xyzal and coal tar shampoo for dandruff. called into a different pharmacy  [facility] [address] [place], [place] [address] [address]: [place] [address]  Phone: [phone]

## 2019-08-14 NOTE — Telephone Encounter (Signed)
Spoke to [place] at the pharmacy.  Prescription for Xyzal was received.  Pt notified to call the pharmacy so they can ready it for her.  Prescription for shampoo is not covered by her insurance and will cost her $27.  Advised pt she can get T-Gel OTC for much less.  Nothing further needed.

## 2020-02-05 ENCOUNTER — Telehealth: Payer: Self-pay | Admitting: Family Medicine

## 2020-02-05 NOTE — Telephone Encounter (Signed)
hey dr Ethlyn Gallery I have one of your pts that would like to bring over her 3 children (ages 78, 38 and 13)and was wanting to see if you would accept them I let her know that you are not accepting newpt but she wanted me to ask.

## 2020-02-05 NOTE — Telephone Encounter (Signed)
Yes i'm ok with that

## 2020-02-06 NOTE — Telephone Encounter (Signed)
Called pt to let her know about the below msg and she will call back with what kind of insurance her children has so that they can get scheduled.

## 2020-02-08 ENCOUNTER — Telehealth: Payer: Self-pay | Admitting: Family Medicine

## 2020-02-08 ENCOUNTER — Telehealth (INDEPENDENT_AMBULATORY_CARE_PROVIDER_SITE_OTHER): Payer: 59 | Admitting: Family Medicine

## 2020-02-08 ENCOUNTER — Other Ambulatory Visit: Payer: Self-pay

## 2020-02-08 ENCOUNTER — Encounter: Payer: Self-pay | Admitting: Family Medicine

## 2020-02-08 ENCOUNTER — Other Ambulatory Visit (INDEPENDENT_AMBULATORY_CARE_PROVIDER_SITE_OTHER): Payer: 59

## 2020-02-08 VITALS — Ht 61.0 in | Wt 136.0 lb

## 2020-02-08 DIAGNOSIS — E611 Iron deficiency: Secondary | ICD-10-CM

## 2020-02-08 DIAGNOSIS — Z889 Allergy status to unspecified drugs, medicaments and biological substances status: Secondary | ICD-10-CM | POA: Diagnosis not present

## 2020-02-08 DIAGNOSIS — R5383 Other fatigue: Secondary | ICD-10-CM

## 2020-02-08 DIAGNOSIS — D649 Anemia, unspecified: Secondary | ICD-10-CM

## 2020-02-08 DIAGNOSIS — R300 Dysuria: Secondary | ICD-10-CM

## 2020-02-08 DIAGNOSIS — N921 Excessive and frequent menstruation with irregular cycle: Secondary | ICD-10-CM

## 2020-02-08 NOTE — Progress Notes (Signed)
Virtual Visit via Video Note  I connected with Nancy Mcmahon  on 02/08/20 at 12:30 PM EDT by a video enabled telemedicine application and verified that I am speaking with the correct person using two identifiers.  Location patient: home Location provider: [place], [place] [address] Persons participating in the virtual visit: patient, provider  I discussed the limitations of evaluation and management by telemedicine and the availability of in person appointments. The patient expressed understanding and agreed to proceed.   Nancy Mcmahon DOB: Sep 02, 1985 Encounter date: 02/08/2020  This is a 34 y.o. female who presents with Chief Complaint  Patient presents with  . Urinary Frequency    Urine frequency, concerns about iron, tired a lot craving non food items x few months     History of present illness: Last visit with me was 06/16/2018 for establishing care.  We discussed multiple allergies or food irritants at that time.  She was referred to allergist.  I do not see follow-up note on this.  She was also referred to hematology for iron deficiency.  I do not see a follow-up note on this.  She didn't follow up with anything due to pandemic. She is craving weird things like baking soda and corn starch. Allergies haven't improved. Feels like everything she eats makes her itch/breaks out mouth.   Always tired. Does get dizzy when she gets up; but takes time. Always cold.   Periods: last two were very heavy and lasted 9 days and were 2 weeks apart. Second lasted 6 days. Usually she is regular, but seem to be getting longer. When she is on cycle the fatigue is extreme. Periods have always been heavy. Normally she menstruates for 7 days. 4-5 out of those days are heavy - changes super plus tampons every 2 hours.   Sometimes feels heart racing. Sometimes feels short of breath - like if she goes up stairs, but not necessarily with exercise.    Allergies  Allergen Reactions  . Banana   . Fish Allergy   . Peanut-Containing Drug Products   . Shellfish Allergy   . Strawberry Flavor   . Watermelon Flavor   . Wheat Bran   . Penicillins Rash   No outpatient medications have been marked as taking for the 02/08/20 encounter (Video Visit) with [name], MD.    Review of Systems  Constitutional: Negative for chills, fatigue and fever.  Respiratory: Negative for cough, chest tightness, shortness of breath and wheezing.   Cardiovascular: Negative for chest pain, palpitations and leg swelling.    Objective:  Ht 5\' 1"  (1.549 m)   Wt 136 lb (61.7 kg)   BMI 25.70 kg/m   Weight: 136 lb (61.7 kg)   BP Readings from Last 3 Encounters:  08/10/19 110/70  06/16/18 118/66  06/11/18 123/79   Wt Readings from Last 3 Encounters:  02/08/20 136 lb (61.7 kg)  08/10/19 136 lb (61.7 kg)  06/16/18 129 lb 9.6 oz (58.8 kg)    EXAM:  GENERAL: alert, oriented, appears well and in no acute distress  HEENT: atraumatic, conjunctiva clear, no obvious abnormalities on inspection of external nose and ears  NECK: normal movements of the head and neck  LUNGS: on inspection no signs of respiratory distress, breathing rate appears normal, no obvious gross SOB, gasping or wheezing  CV: no obvious cyanosis  MS: moves all visible extremities without noticeable abnormality  PSYCH/NEURO: pleasant and cooperative. She is tearful today  because the fatigue is affecting other aspects of her life. She is having a hard time completing her work and feels like she is just "lazy" but really she is so tired.   Assessment/Plan  1. Iron deficiency She has not been able to tolerate oral iron supplements in the past due to stomach upset and constipation.  She did have a severe iron deficiency and the last time we checked her blood work, which was a year and a half ago.  I am worried that this is continued to get worse with her menorrhagia.  Discussed  importance of.  Control in order to help her normalize blood counts.  I do feel that she will end up needing to see hematology for an iron infusion, and depending on hemoglobin, may need a blood transfusion as well.  We will have her come in today for blood work so we can see where she is at currently. - CBC with Differential/Platelet; Future - Iron, TIBC and Ferritin Panel; Future - Ambulatory referral to Hematology  2. Menorrhagia with irregular cycle We discussed that if she is having fibroids, she should see gynecology for potential further management from that aspect.  She is willing to do something to help with.  Control.  We discussed OCPs versus IUD.  She does have her tubes tied, so does not need birth control from that standpoint.  I do think IUD causes less long-term side effects and would be preferred to her being on oral contraception. - TSH; Future - US Pelvic Complete With Transvaginal; Future  3. Multiple allergies - Ambulatory referral to Allergy  4. Dysuria Symptoms have improved, but she is still having some urinary frequency.  We will check urine when she is in for blood work. - Urinalysis with Culture Reflex; Future  5. Anemia, unspecified type See plan above. - Vitamin B12; Future - Folate; Future - Ambulatory referral to Hematology  6. Fatigue, unspecified type - Comprehensive metabolic panel; Future    I discussed the assessment and treatment plan with the patient. The patient was provided an opportunity to ask questions and all were answered. The patient agreed with the plan and demonstrated an understanding of the instructions.   The patient was advised to call back or seek an in-person evaluation if the symptoms worsen or if the condition fails to improve as anticipated.  I provided 30 minutes of non-face-to-face time during this encounter.  She will come for bloodwork now and follow up will be pending results. Micheline Rough, MD

## 2020-02-09 LAB — COMPREHENSIVE METABOLIC PANEL
AG Ratio: 1.4 (calc) (ref 1.0–2.5)
ALT: 8 U/L (ref 6–29)
AST: 15 U/L (ref 10–30)
Albumin: 4.4 g/dL (ref 3.6–5.1)
Alkaline phosphatase (APISO): 43 U/L (ref 31–125)
BUN: 10 mg/dL (ref 7–25)
CO2: 26 mmol/L (ref 20–32)
Calcium: 9.3 mg/dL (ref 8.6–10.2)
Chloride: 102 mmol/L (ref 98–110)
Creat: 0.66 mg/dL (ref 0.50–1.10)
Globulin: 3.2 g/dL (calc) (ref 1.9–3.7)
Glucose, Bld: 78 mg/dL (ref 65–99)
Potassium: 4.5 mmol/L (ref 3.5–5.3)
Sodium: 136 mmol/L (ref 135–146)
Total Bilirubin: 1 mg/dL (ref 0.2–1.2)
Total Protein: 7.6 g/dL (ref 6.1–8.1)

## 2020-02-09 LAB — CBC WITH DIFFERENTIAL/PLATELET
Absolute Monocytes: 429 cells/uL (ref 200–950)
Basophils Absolute: 32 cells/uL (ref 0–200)
Basophils Relative: 0.6 %
Eosinophils Absolute: 127 cells/uL (ref 15–500)
Eosinophils Relative: 2.4 %
HCT: 25.6 % — ABNORMAL LOW (ref 35.0–45.0)
Hemoglobin: 7.7 g/dL — ABNORMAL LOW (ref 11.7–15.5)
Lymphs Abs: 1495 cells/uL (ref 850–3900)
MCH: 21.8 pg — ABNORMAL LOW (ref 27.0–33.0)
MCHC: 30.1 g/dL — ABNORMAL LOW (ref 32.0–36.0)
MCV: 72.3 fL — ABNORMAL LOW (ref 80.0–100.0)
MPV: 10.6 fL (ref 7.5–12.5)
Monocytes Relative: 8.1 %
Neutro Abs: 3217 cells/uL (ref 1500–7800)
Neutrophils Relative %: 60.7 %
Platelets: 377 10*3/uL (ref 140–400)
RBC: 3.54 10*6/uL — ABNORMAL LOW (ref 3.80–5.10)
RDW: 16.9 % — ABNORMAL HIGH (ref 11.0–15.0)
Total Lymphocyte: 28.2 %
WBC: 5.3 10*3/uL (ref 3.8–10.8)

## 2020-02-09 LAB — FOLATE: Folate: 15.4 ng/mL

## 2020-02-09 LAB — IRON,TIBC AND FERRITIN PANEL
%SAT: 4 % (calc) — ABNORMAL LOW (ref 16–45)
Ferritin: 2 ng/mL — ABNORMAL LOW (ref 16–154)
Iron: 20 ug/dL — ABNORMAL LOW (ref 40–190)
TIBC: 479 mcg/dL (calc) — ABNORMAL HIGH (ref 250–450)

## 2020-02-09 LAB — TSH: TSH: 1.57 mIU/L

## 2020-02-09 LAB — VITAMIN B12: Vitamin B-12: 375 pg/mL (ref 200–1100)

## 2020-02-11 ENCOUNTER — Telehealth: Payer: Self-pay | Admitting: Hematology and Oncology

## 2020-02-11 LAB — URINALYSIS W MICROSCOPIC + REFLEX CULTURE
Bilirubin Urine: NEGATIVE
Glucose, UA: NEGATIVE
Hgb urine dipstick: NEGATIVE
Hyaline Cast: NONE SEEN /LPF
Nitrites, Initial: NEGATIVE
Protein, ur: NEGATIVE
RBC / HPF: NONE SEEN /HPF (ref 0–2)
Specific Gravity, Urine: 1.016 (ref 1.001–1.03)
pH: 5.5 (ref 5.0–8.0)

## 2020-02-11 LAB — URINE CULTURE

## 2020-02-11 LAB — CULTURE INDICATED

## 2020-02-11 NOTE — Telephone Encounter (Signed)
Received a new hem referral from Dr. Ethlyn Gallery for IDA. Ms. Nancy Mcmahon has been cld and sheduled to see Dr. Lorenso Courier on 11/10 at 1pm. Pt aware to arrive 15 minutes early.

## 2020-02-13 ENCOUNTER — Other Ambulatory Visit: Payer: Self-pay

## 2020-02-13 ENCOUNTER — Inpatient Hospital Stay: Payer: 59 | Attending: Hematology and Oncology | Admitting: Hematology and Oncology

## 2020-02-13 ENCOUNTER — Inpatient Hospital Stay: Payer: 59

## 2020-02-13 VITALS — BP 127/69 | HR 98 | Temp 97.7°F | Resp 16 | Ht 61.0 in | Wt 132.7 lb

## 2020-02-13 DIAGNOSIS — R5383 Other fatigue: Secondary | ICD-10-CM | POA: Diagnosis not present

## 2020-02-13 DIAGNOSIS — D509 Iron deficiency anemia, unspecified: Secondary | ICD-10-CM | POA: Diagnosis not present

## 2020-02-13 NOTE — Progress Notes (Signed)
[facility] [phone]   Fax:([phone]) [place] NOTE  Patient Care Team: [name], MD as PCP - General (Family Medicine)  Hematological/Oncological History # Iron Deficiency Anemia 2/2 to GYN Bleeding  1) 08/20/2016: WBC 7.0, Hgb 10.5, MCV 83.2 2) 06/16/2018: WBC 9.3, Hgb 7.9, MCV 72.5, Plt 361 3) 02/08/2020: WBC 5.3, Hgb 7.7, MCV 72.3, Plt 377. Iron 20, TIBC 479, Ferritin 2, Tsat 4% 4) 02/13/2020: establish care with Dr. Lorenso Courier   CHIEF COMPLAINTS/PURPOSE OF CONSULTATION:  " Iron Deficiency Anemia 2/2 to GYN Bleeding  "  HISTORY OF PRESENTING ILLNESS:  Nancy Mcmahon 34 y.o. female with medical history significant for asthma, allergies, and iron deficiency anemia who presents for evaluation of her iron deficiency anemia.   On review of the previous records [name] our earliest records show iron deficiency anemia dating back to 08/20/2016.  At the time she was noted to have a hemoglobin of 10.5 and MCV of 83.2.  More recently on 02/08/2020 the patient was found to have white blood cell count of 5.3, hemoglobin 7.7, MCV of 72.3, platelets of 377.  Iron studies the time showed iron of 20, total iron-binding pasty of 479, ferritin of 2 and a TSAT of 4%.  Due to concern for this patient's iron deficiency anemia and inability to tolerate p.o. iron she was referred to hematology for further evaluation and management.  On exam today [name] reports that she has been aware of her iron deficiency anemia since at least the age of 42.  This was at the time she had her son (who is now 10 years old) and was told that she might need a blood transfusion.  She reports that she has never required a blood transfusion and has never received an iron infusion.  She reports that she has periodically been on iron pills "every now and again" but she has suffered from constipation while taking these medications.  She reports that she is never had any issues with  nausea or stomach upset while on the medication.  On further discussion she notes that she has not tried to increase iron in her diet.  She reports that she does not like red meat and she has a shellfish allergy.  She does eat her vegetables including broccoli spinach greens and corn.  In regards to her blood loss she reports that she has irregular menstrual cycles and that they can last up to 8 days.  She reports that the last 2 cycles were only 2 weeks apart and that during her heaviest days she goes to about 1 tampon per hour.  She reports that she has a GYN appointment set up on 02/22/2020.  Additional symptoms she has had include cramping, headaches, nausea, ice cravings, lightheadedness, and dizziness.  She has also had a craving for cornstarch and other nonfood items.  Her family history is remarkable for iron deficiency anemia in her niece and seizures in her mother.  She reports that her maternal grandfather died of stomach cancer.  She reports that she has 3 children ages 25 and 23 and 64 and they are all healthy.  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  Otherwise she currently denies having any issues with fevers, chills, sweats, diarrhea, constipation, chest pain, or other overt signs of bleeding such as dark stools or nosebleeds.  A full 10 point ROS is listed below.  MEDICAL HISTORY:  Past Medical History:  Diagnosis Date  . Allergy   . Anemia   . Childhood asthma     SURGICAL HISTORY: Past Surgical History:  Procedure Laterality Date  . TUBAL LIGATION      SOCIAL HISTORY: Social History   Socioeconomic History  . Marital status: Married    Spouse name: Not on file  . Number of children: Not on file  . Years of education: Not on file  . Highest education level: Not on file  Occupational History  . Not on file  Tobacco Use  . Smoking status: Never Smoker  . Smokeless tobacco: Never Used  Substance and Sexual Activity  . Alcohol use: No     Alcohol/week: 0.0 standard drinks  . Drug use: No  . Sexual activity: Yes  Other Topics Concern  . Not on file  Social History Narrative  . Not on file   Social Determinants of Health   Financial Resource Strain:   . Difficulty of Paying Living Expenses: Not on file  Food Insecurity:   . Worried About Charity fundraiser in the Last Year: Not on file  . Ran Out of Food in the Last Year: Not on file  Transportation Needs:   . Lack of Transportation (Medical): Not on file  . Lack of Transportation (Non-Medical): Not on file  Physical Activity:   . Days of Exercise per Week: Not on file  . Minutes of Exercise per Session: Not on file  Stress:   . Feeling of Stress : Not on file  Social Connections:   . Frequency of Communication with Friends and Family: Not on file  . Frequency of Social Gatherings with Friends and Family: Not on file  . Attends Religious Services: Not on file  . Active Member of Clubs or Organizations: Not on file  . Attends Archivist Meetings: Not on file  . Marital Status: Not on file  Intimate Partner Violence:   . Fear of Current or Ex-Partner: Not on file  . Emotionally Abused: Not on file  . Physically Abused: Not on file  . Sexually Abused: Not on file    FAMILY HISTORY: Family History  Problem Relation Age of Onset  . Seizures Mother   . Cirrhosis Father   . Crohn's disease Son   . Cancer Maternal Grandfather        stomach    ALLERGIES:  is allergic to banana, fish allergy, peanut-containing drug products, shellfish allergy, strawberry flavor, watermelon flavor, wheat bran, and penicillins.  MEDICATIONS:  Current Outpatient Medications  Medication Sig Dispense Refill  . Coal Tar Extract 2 % SHAM Use 3 times a week (Patient not taking: Reported on 02/08/2020) 236 mL 0  . EPINEPHrine 0.3 mg/0.3 mL IJ SOAJ injection Inject 0.3 mLs (0.3 mg total) into the muscle as needed for anaphylaxis. (Patient not taking: Reported on  02/08/2020) 2 Device 5  . ferrous sulfate 325 (65 FE) MG tablet Take 1 tablet (325 mg total) by mouth daily with breakfast. (Patient not taking: Reported on 02/08/2020) 90 tablet 1  . levocetirizine (XYZAL) 5 MG tablet Take 1 tablet (5 mg total) by mouth every evening. 30 tablet 2  . predniSONE (DELTASONE) 10 MG tablet Take 1 tablet (10 mg total) by mouth daily with breakfast. (Patient not taking: Reported on 02/08/2020) 5 tablet 0   No current facility-administered medications for this visit.    REVIEW OF SYSTEMS:   Constitutional: ( - ) fevers, ( - )  chills , ( - )  night sweats Eyes: ( - ) blurriness of vision, ( - ) double vision, ( - ) watery eyes Ears, nose, mouth, throat, and face: ( - ) mucositis, ( - ) sore throat Respiratory: ( - ) cough, ( - ) dyspnea, ( - ) wheezes Cardiovascular: ( - ) palpitation, ( - ) chest discomfort, ( - ) lower extremity swelling Gastrointestinal:  ( - ) nausea, ( - ) heartburn, ( - ) change in bowel habits Skin: ( - ) abnormal skin rashes Lymphatics: ( - ) new lymphadenopathy, ( - ) easy bruising Neurological: ( - ) numbness, ( - ) tingling, ( - ) new weaknesses Behavioral/Psych: ( - ) mood change, ( - ) new changes  All other systems were reviewed with the patient and are negative.  PHYSICAL EXAMINATION:  Vitals:   02/13/20 1326  BP: 127/69  Pulse: 98  Resp: 16  Temp: 97.7 F (36.5 C)  SpO2: 100%   Filed Weights   02/13/20 1326  Weight: 132 lb 11.2 oz (60.2 kg)    GENERAL: well appearing young African American female in NAD  SKIN: skin color, texture, turgor are normal, no rashes or significant lesions EYES: conjunctiva are pink and non-injected, sclera clear LUNGS: clear to auscultation and percussion with normal breathing effort HEART: regular rate & rhythm and no murmurs and no lower extremity edema Musculoskeletal: no cyanosis of digits and no clubbing  PSYCH: alert & oriented x 3, fluent speech NEURO: no focal motor/sensory  deficits  LABORATORY DATA:  I have reviewed the data as listed CBC Latest Ref Rng & Units 02/08/2020 06/16/2018 08/12/2016  WBC 3.8 - 10.8 Thousand/uL 5.3 9.3 7.0  Hemoglobin 11.7 - 15.5 g/dL 7.7(L) 7.9 Repeated and verified X2.(LL) 10.5(A)  Hematocrit 35 - 45 % 25.6(L) 25.2 Repeated and verified X2.(L) 32.0(A)  Platelets 140 - 400 Thousand/uL 377 361.0 -    CMP Latest Ref Rng & Units 02/08/2020 06/16/2018 08/13/2016  Glucose 65 - 99 mg/dL 78 66(L) -  BUN 7 - 25 mg/dL 10 14 8   Creatinine 0.50 - 1.10 mg/dL 0.66 0.91 0.9  Sodium 135 - 146 mmol/L 136 140 138  Potassium 3.5 - 5.3 mmol/L 4.5 3.5 4.0  Chloride 98 - 110 mmol/L 102 103 -  CO2 20 - 32 mmol/L 26 27 -  Calcium 8.6 - 10.2 mg/dL 9.3 8.7 -  Total Protein 6.1 - 8.1 g/dL 7.6 7.1 -  Total Bilirubin 0.2 - 1.2 mg/dL 1.0 0.8 -  Alkaline Phos 39 - 117 U/L - 36(L) 39  AST 10 - 30 U/L 15 31 19   ALT 6 - 29 U/L 8 51(H) 13     RADIOGRAPHIC STUDIES: No results found.  ASSESSMENT & PLAN ZYIA KANEKO 34 y.o. female with medical history significant for asthma, allergies, and iron deficiency anemia who presents for evaluation of her iron deficiency anemia.  After review the labs, the records, discussion with the patient the findings are most consistent with iron deficiency anemia secondary GYN bleeding.  Given her severe degree of anemia would recommend we proceed with IV iron therapy with IV Feraheme 510 mg q. 7 days x 2 doses to start as soon as we are able.  After she has completed her second dose I recommend that she return to clinic in approximately 4 to 6 weeks time.  Fortunately the patient does have OB/GYN follow-up scheduled on 02/22/2020 in order to help her deal with the heavy menstrual cycles.    # Iron Deficiency Anemia  2/2 to GYN Bleeding  --fresh labs available from 02/08/2020 show clear iron deficiency anemia. No need to repeat labs today --plan for IV feraheme 510mg  q 7 days x 2 doses to start next week --patient has OB/GYN f/u  scheduled on 02/22/2020 --plan to have the patient return to clinic approximately 4-6 weeks after last dose of IV iron.   No orders of the defined types were placed in this encounter.   All questions were answered. The patient knows to call the clinic with any problems, questions or concerns.  A total of more than 60 minutes were spent on this encounter and over half of that time was spent on counseling and coordination of care as outlined above.   Ledell Peoples, MD Department of Hematology/Oncology [facility] Phone: [phone] Pager: [phone] Email: [email or web address]  02/13/2020 1:30 PM

## 2020-02-15 ENCOUNTER — Encounter: Payer: Self-pay | Admitting: Allergy

## 2020-02-15 ENCOUNTER — Other Ambulatory Visit: Payer: Self-pay

## 2020-02-15 ENCOUNTER — Ambulatory Visit (INDEPENDENT_AMBULATORY_CARE_PROVIDER_SITE_OTHER): Payer: 59 | Admitting: Allergy

## 2020-02-15 VITALS — BP 118/72 | HR 90 | Temp 99.2°F | Resp 16 | Ht 60.63 in | Wt 134.4 lb

## 2020-02-15 DIAGNOSIS — J3089 Other allergic rhinitis: Secondary | ICD-10-CM | POA: Diagnosis not present

## 2020-02-15 DIAGNOSIS — T50905D Adverse effect of unspecified drugs, medicaments and biological substances, subsequent encounter: Secondary | ICD-10-CM

## 2020-02-15 DIAGNOSIS — H1013 Acute atopic conjunctivitis, bilateral: Secondary | ICD-10-CM | POA: Diagnosis not present

## 2020-02-15 DIAGNOSIS — T7800XD Anaphylactic reaction due to unspecified food, subsequent encounter: Secondary | ICD-10-CM | POA: Diagnosis not present

## 2020-02-15 MED ORDER — EPINEPHRINE 0.3 MG/0.3ML IJ SOAJ: 0.3000 mg | INTRAMUSCULAR | 1 refills | Status: DC | PRN

## 2020-02-15 NOTE — Patient Instructions (Addendum)
Allergic rhinitis with conjunctivitis -Environmental allergy skin testing is positive to grass pollens, weed pollens, tree pollens, molds -Allergen avoidance measures discussed/handouts provided -For general allergy symptom relief can take a long-acting antihistamine like Xyzal 5 mg, Allegra 180 mg or Zyrtec 10 mg daily as needed -For watery, itchy eyes can use Pataday or Pataday extra strength 1 drop each eye daily as needed -For nasal congestion can use Flonase, Rhinocort or Nasacort 2 sprays each nostril daily for 1 to 2 weeks at a time for maximum benefit. -All the above medications are over-the-counter options -If medication management is not effective for you then consider a course of allergen immunotherapy.  Allergen immunotherapy is a 3 to 5-year process that helps to "retrain" the allergic part of your immune system to be more tolerant to the allergens above and therefore less symptomatic and decrease medication needs.  Those who undergo immunotherapy can also usually eats the fruits without development of oral symptoms if you no longer are pollen allergic.  Food allergy -Food allergy testing is negative.  Will obtain serum IgE levels to these foods to determine if able to perform in-office food challenges. -See oral allergy syndrome information below -Continue avoidance of all nuts, fish, shellfish, banana, strawberry and pineapple -Have access to self-injectable epinephrine AuviQ 0.3mg  at all times -Follow emergency action plan in case of allergic reaction  Drug allergy -Continue avoidance of penicillin based antibiotics -Discussed today you are not likely to be allergic at this point in time given your childhood history. -Recommend an in office graded challenge to penicillin to see if you are no longer allergic.  You can schedule for a graded challenge appointment when ready.  Expect to be in the office for at least 2 hours for the challenge.  Hold all antihistamines for the challenge  for 3 days prior to this visit.   Follow-up in 6 months or sooner if needed (i.e. for oral drug challenge)   The oral allergy syndrome (OAS) or pollen-food allergy syndrome (PFAS) is a relatively common form of food allergy, particularly in adults. It typically occurs in people who have pollen allergies when the immune system "sees" proteins on the food that look like proteins on the pollen. This results in the allergy antibody (IgE) binding to the food instead of the pollen. Patients typically report itching and/or mild swelling of the mouth and throat immediately following ingestion of certain uncooked fruits (including nuts) or raw vegetables. Only a very small number of affected individuals experience systemic allergic reactions, such as anaphylaxis which occurs with true food allergies.

## 2020-02-15 NOTE — Progress Notes (Signed)
New Patient Note  RE: Nancy Mcmahon MRN: [id number] DOB: 1985/04/26 Date of Office Visit: 02/15/2020  Referring provider: [name], MD Primary care provider: [name], MD  Chief Complaint: food allergy  History of present illness: Nancy Mcmahon is a 34 y.o. female presenting today for consultation for food allergy.   She has a history of food allergy for the past 10 years.  With peanuts and tree nuts she reports developing itching.  This occurs quickly with ingestion.   She states itching also has occurred with sunflower seeds.  With shellfish and fish she feels like her throat is closing up.  She states she can smell it and develop throat closure symptoms.   Banana, strawberry, pineapple, wheat cause oral itch and mouth rash and lip itching.  She states she was prescribed an epipen but it was going to cost at $500 thus she did not get this. She states she has no issues with watermelon.   She states she is pollen allergic as well as to dust. She reports watery and puffy eyes, sneezing, congestion.  She will use generic Norel as needed.   She has a childhood history of hives after taking penicillin per report of her mother.  She has been avoiding penicillin since childhood.     She states she does have iron deficiency and is receiving iron infusions.  She states the oral symptoms can due to the iron deficiency and thus she is not sure if of her oral symptoms are food related or related to iron deficiency.  Review of systems: Review of Systems  Constitutional: Negative.   HENT: Negative.   Eyes: Negative.   Respiratory: Negative.   Cardiovascular: Negative.   Gastrointestinal: Negative.   Musculoskeletal: Negative.   Skin: Negative.   Neurological: Negative.     All other systems negative unless noted above in HPI  Past medical history: Past Medical History:  Diagnosis Date  . Allergy   . Anemia   . Childhood asthma     Past surgical  history: Past Surgical History:  Procedure Laterality Date  . ADENOIDECTOMY    . TONSILLECTOMY    . TUBAL LIGATION    . TUBAL LIGATION      Family history:  Family History  Problem Relation Age of Onset  . Seizures Mother   . Allergic rhinitis Mother   . Cirrhosis Father   . Crohn's disease Son   . Allergic rhinitis Son   . Cancer Maternal Grandfather        stomach  . Allergic rhinitis Daughter   . Allergic rhinitis Daughter     Social history: Lives in a townhome with gas heating and central cooling.  No pets in the home.  There is no concern for water damage, mildew or roaches in the home.  She does not report an occupation nor smoking history on the environmental form.  Medication List: No current outpatient medications on file.   No current facility-administered medications for this visit.    Known medication allergies: Allergies  Allergen Reactions  . Banana   . Fish Allergy   . Peanut-Containing Drug Products   . Shellfish Allergy   . Strawberry Flavor   . Watermelon Flavor   . Wheat Bran   . Penicillins Rash     Physical examination: Blood pressure 118/72, pulse 90, temperature 99.2 F (37.3 C), temperature source Temporal, resp. rate 16, height 5' 0.63" (1.54 m), weight 134 lb 6.4 oz (61  kg).  General: Alert, interactive, in no acute distress. HEENT: PERRLA, TMs pearly gray, turbinates non-edematous without discharge, post-pharynx non erythematous. Neck: Supple without lymphadenopathy. Lungs: Clear to auscultation without wheezing, rhonchi or rales. {no increased work of breathing. CV: Normal S1, S2 without murmurs. Abdomen: Nondistended, nontender. Skin: Warm and dry, without lesions or rashes. Extremities:  No clubbing, cyanosis or edema. Neuro:   Grossly intact.  Diagnositics/Labs: Allergy testing: Environmental allergy skin prick testing was positive to [place] blue, meadow fescue, sweet Vernal, Timothy, short ragweed, birch, Box Elder, elm,  hickory, oak, Alternaria, Aspergillus, Curvularia, Fusarium,epicoccum.   Select food allergy skin prick testing is negative. Allergy testing results were read and interpreted by provider, documented by clinical staff.   Assessment and plan: Patient Instructions  Allergic rhinitis with conjunctivitis -Environmental allergy skin testing is positive to grass pollens, weed pollens, tree pollens, molds -Allergen avoidance measures discussed/handouts provided -For general allergy symptom relief can take a long-acting antihistamine like Xyzal 5 mg, Allegra 180 mg or Zyrtec 10 mg daily as needed -For watery, itchy eyes can use Pataday or Pataday extra strength 1 drop each eye daily as needed -For nasal congestion can use Flonase, Rhinocort or Nasacort 2 sprays each nostril daily for 1 to 2 weeks at a time for maximum benefit. -All the above medications are over-the-counter options -If medication management is not effective for you then consider a course of allergen immunotherapy.  Allergen immunotherapy is a 3 to 5-year process that helps to "retrain" the allergic part of your immune system to be more tolerant to the allergens above and therefore less symptomatic and decrease medication needs.  Those who undergo immunotherapy can also usually eats the fruits without development of oral symptoms if you no longer are pollen allergic.  Food allergy -Food allergy testing is negative.  Will obtain serum IgE levels to these foods to determine if able to perform in-office food challenges. -See oral allergy syndrome information below -Continue avoidance of all nuts, fish, shellfish, banana, strawberry and pineapple -Have access to self-injectable epinephrine AuviQ 0.3mg  at all times -Follow emergency action plan in case of allergic reaction  The oral allergy syndrome (OAS) or pollen-food allergy syndrome (PFAS) is a relatively common form of food allergy, particularly in adults. It typically occurs in people  who have pollen allergies when the immune system "sees" proteins on the food that look like proteins on the pollen. This results in the allergy antibody (IgE) binding to the food instead of the pollen. Patients typically report itching and/or mild swelling of the mouth and throat immediately following ingestion of certain uncooked fruits (including nuts) or raw vegetables. Only a very small number of affected individuals experience systemic allergic reactions, such as anaphylaxis which occurs with true food allergies.   Drug allergy -Continue avoidance of penicillin based antibiotics -Discussed today you are not likely to be allergic at this point in time given your childhood history. -Recommend an in office graded challenge to penicillin to see if you are no longer allergic.  You can schedule for a graded challenge appointment when ready.  Expect to be in the office for at least 2 hours for the challenge.  Hold all antihistamines for the challenge for 3 days prior to this visit.   Follow-up in 6 months or sooner if needed (i.e. for oral drug challenge)   I appreciate the opportunity to take part in Nancy Mcmahon's care. Please do not hesitate to contact me with questions.  Sincerely,   Prudy Feeler, MD Allergy/Immunology  [facility]

## 2020-02-18 LAB — ALLERGEN PROFILE, FOOD-FISH
Allergen Mackerel IgE: <0.1 kU/L
Allergen Salmon IgE: <0.1 kU/L
Allergen Trout IgE: <0.1 kU/L
Allergen Walley Pike IgE: <0.1 kU/L
Codfish IgE: <0.1 kU/L
Halibut IgE: <0.1 kU/L
Tuna: <0.1 kU/L

## 2020-02-18 LAB — ALLERGEN, WHEAT, F4: Wheat IgE: <0.1 kU/L

## 2020-02-18 LAB — ALLERGENS(7)
Brazil Nut IgE: <0.1 kU/L
F020-IgE Almond: <0.1 kU/L
F202-IgE Cashew Nut: <0.1 kU/L
Hazelnut (Filbert) IgE: 0.42 kU/L — AB
Peanut IgE: <0.1 kU/L
Pecan Nut IgE: <0.1 kU/L
Walnut IgE: <0.1 kU/L

## 2020-02-18 LAB — ALLERGEN PROFILE, SHELLFISH
Clam IgE: <0.1 kU/L
F023-IgE Crab: <0.1 kU/L
F080-IgE Lobster: <0.1 kU/L
F290-IgE Oyster: <0.1 kU/L
Scallop IgE: <0.1 kU/L
Shrimp IgE: 0.11 kU/L — AB

## 2020-02-18 LAB — ALLERGEN, PINEAPPLE, F210: Pineapple IgE: <0.1 kU/L

## 2020-02-18 LAB — ALLERGEN, STRAWBERRY, F44: Allergen Strawberry IgE: <0.1 kU/L

## 2020-02-19 ENCOUNTER — Encounter: Payer: Self-pay | Admitting: Hematology and Oncology

## 2020-02-20 ENCOUNTER — Telehealth: Payer: Self-pay | Admitting: Family Medicine

## 2020-02-20 ENCOUNTER — Other Ambulatory Visit: Payer: Self-pay | Admitting: Family Medicine

## 2020-02-20 ENCOUNTER — Telehealth: Payer: Self-pay | Admitting: *Deleted

## 2020-02-20 ENCOUNTER — Ambulatory Visit
Admission: RE | Admit: 2020-02-20 | Discharge: 2020-02-20 | Disposition: A | Discharge status: Home / Self Care | Payer: 59 | Source: Ambulatory Visit | Attending: Family Medicine | Admitting: Family Medicine

## 2020-02-20 DIAGNOSIS — N921 Excessive and frequent menstruation with irregular cycle: Secondary | ICD-10-CM

## 2020-02-20 DIAGNOSIS — R300 Dysuria: Secondary | ICD-10-CM

## 2020-02-20 MED ORDER — NITROFURANTOIN MONOHYD MACRO 100 MG PO CAPS: 100.0000 mg | ORAL_CAPSULE | Freq: Two times a day (BID) | ORAL | 0 refills | Status: AC

## 2020-02-20 NOTE — Telephone Encounter (Signed)
Ok. I have sent in Diablo for her (which I picked because she has tolerated this in the past) and I would like her to drop off urine culture after she completes this treatment.

## 2020-02-20 NOTE — Telephone Encounter (Signed)
Patient called for test results

## 2020-02-20 NOTE — Telephone Encounter (Signed)
Culture was not run due to incorrect tube urine was sent in. If she is having sx of UTI let me know and we will treat. If no symptoms, I would suggest repeat urine culture so we can make sure no underlying infection.

## 2020-02-20 NOTE — Telephone Encounter (Signed)
Spoke with the pt and she stated she was calling for results of the urine tests?  Message sent to PCP.

## 2020-02-20 NOTE — Telephone Encounter (Signed)
Labs reviewed.

## 2020-02-20 NOTE — Addendum Note (Signed)
Addended by: Agnes Lawrence on: 02/20/2020 04:45 PM   Modules accepted: Orders

## 2020-02-20 NOTE — Telephone Encounter (Signed)
Patient informed of the message below and advised to call the office for a lab appt for a urine culture once she completes the antibiotic.

## 2020-02-20 NOTE — Telephone Encounter (Signed)
Spoke with the pt and informed her of the message below.  Patient stated she has had urinary urgency and frequency every 30-45 minutes for the past year, denies any blood or burning sensation and she forgot to mention this to Dr Ethlyn Gallery.  Message sent to PCP.

## 2020-02-20 NOTE — Telephone Encounter (Signed)
Patient came into office inquiring about her lab results. She was able to see them in her MyChart and is wondering the next best step. I advised that I would speak with you today and see if you could review them and informed her that I would call her back today. Patient verbalized understanding.

## 2020-02-21 ENCOUNTER — Telehealth: Payer: Self-pay

## 2020-02-21 ENCOUNTER — Telehealth: Payer: Self-pay | Admitting: *Deleted

## 2020-02-21 NOTE — Telephone Encounter (Signed)
TCT patient as she wanted to know which iron infusion she will be receiving. Informed her that she will be receiving  Madilyn Hook which is done in 2 doses 1 week apart. Pt voiced understanding.

## 2020-02-21 NOTE — Telephone Encounter (Signed)
(  Nancy Mcmahon pt) Called pt per 02/20/20 inbasket for IV iron as [facility] is booked.  Pt is aware of her appts and will view them in mychart.     AOM

## 2020-02-25 ENCOUNTER — Inpatient Hospital Stay: Payer: 59

## 2020-02-25 ENCOUNTER — Telehealth: Payer: Self-pay

## 2020-02-25 ENCOUNTER — Other Ambulatory Visit: Payer: Self-pay

## 2020-02-25 VITALS — BP 111/69 | HR 87 | Temp 98.1°F | Resp 16

## 2020-02-25 DIAGNOSIS — D509 Iron deficiency anemia, unspecified: Secondary | ICD-10-CM | POA: Diagnosis not present

## 2020-02-25 MED ORDER — SODIUM CHLORIDE 0.9 % IV SOLN
Freq: Once | INTRAVENOUS | Status: AC
  Filled 2020-02-25: qty 250

## 2020-02-25 MED ORDER — SODIUM CHLORIDE 0.9 % IV SOLN
200.0000 mg | Freq: Once | INTRAVENOUS | Status: AC
  Administered 2020-02-25: 200 mg via INTRAVENOUS
  Filled 2020-02-25: qty 10

## 2020-02-25 NOTE — Telephone Encounter (Signed)
appts made and printed for pt her secure chat 02/25/20   AOM

## 2020-02-25 NOTE — Progress Notes (Signed)
Pt discharged in no apparent distress. Pt left ambulatory without assistance. Pt aware of discharge instructions and verbalized understanding and had no further questions.  

## 2020-02-25 NOTE — Patient Instructions (Signed)

## 2020-02-25 NOTE — Progress Notes (Signed)
.   Intravenous Iron Formulation Change Nancy Mcmahon has insurance that requires a change in intravenous iron product from Feraheme to Venofer. Orders have been updated to reflect this change and scheduling message sent to adjust infusion appointments. Dr Lorenso Courier notified and agrees with the plan.  Allergies:  Allergies  Allergen Reactions  . Banana   . Fish Allergy   . Peanut-Containing Drug Products   . Shellfish Allergy   . Strawberry Flavor   . Watermelon Flavor   . Wheat Bran   . Penicillins Rash    The plan for iron therapy is as follows: Venofer 200 mg IVPB x 5 doses at least 2 days apart.  Nancy Mcmahon 02/25/2020

## 2020-03-03 ENCOUNTER — Other Ambulatory Visit: Payer: Self-pay

## 2020-03-03 ENCOUNTER — Inpatient Hospital Stay: Payer: 59

## 2020-03-03 VITALS — BP 103/60 | HR 72 | Temp 98.7°F | Resp 17

## 2020-03-03 DIAGNOSIS — D509 Iron deficiency anemia, unspecified: Secondary | ICD-10-CM | POA: Diagnosis not present

## 2020-03-03 MED ORDER — SODIUM CHLORIDE 0.9 % IV SOLN
Freq: Once | INTRAVENOUS | Status: AC
  Filled 2020-03-03: qty 250

## 2020-03-03 MED ORDER — SODIUM CHLORIDE 0.9 % IV SOLN
200.0000 mg | Freq: Once | INTRAVENOUS | Status: AC
  Administered 2020-03-03: 200 mg via INTRAVENOUS
  Filled 2020-03-03: qty 200

## 2020-03-03 NOTE — Patient Instructions (Signed)

## 2020-03-03 NOTE — Progress Notes (Signed)
Pt discharged in no apparent distress. Pt left ambulatory without assistance. Pt aware of discharge instructions and verbalized understanding and had no further questions.  

## 2020-03-10 ENCOUNTER — Other Ambulatory Visit: Payer: Self-pay

## 2020-03-10 ENCOUNTER — Inpatient Hospital Stay: Payer: 59 | Attending: Hematology and Oncology

## 2020-03-10 VITALS — BP 119/81 | HR 75 | Temp 98.6°F | Resp 17

## 2020-03-10 DIAGNOSIS — D509 Iron deficiency anemia, unspecified: Secondary | ICD-10-CM | POA: Diagnosis not present

## 2020-03-10 MED ORDER — SODIUM CHLORIDE 0.9 % IV SOLN
Freq: Once | INTRAVENOUS | Status: AC
  Filled 2020-03-10: qty 250

## 2020-03-10 MED ORDER — SODIUM CHLORIDE 0.9 % IV SOLN
200.0000 mg | Freq: Once | INTRAVENOUS | Status: AC
  Administered 2020-03-10: 200 mg via INTRAVENOUS
  Filled 2020-03-10: qty 200

## 2020-03-10 NOTE — Progress Notes (Signed)
Pt declined to stay for post infusion observation period. Pt stated she has tolerated medication multiple times prior without difficulty. Pt aware to call clinic with any questions or concerns. Pt verbalized understanding and had no further questions.  Pt discharged in stable condition.  

## 2020-03-10 NOTE — Patient Instructions (Signed)

## 2020-03-13 ENCOUNTER — Other Ambulatory Visit: Payer: Self-pay

## 2020-03-13 ENCOUNTER — Ambulatory Visit (INDEPENDENT_AMBULATORY_CARE_PROVIDER_SITE_OTHER): Payer: 59 | Admitting: Family Medicine

## 2020-03-13 ENCOUNTER — Encounter: Payer: Self-pay | Admitting: Family Medicine

## 2020-03-13 VITALS — BP 110/74 | HR 84 | Temp 98.4°F | Wt 138.2 lb

## 2020-03-13 DIAGNOSIS — M25561 Pain in right knee: Secondary | ICD-10-CM

## 2020-03-13 DIAGNOSIS — M76891 Other specified enthesopathies of right lower limb, excluding foot: Secondary | ICD-10-CM

## 2020-03-13 MED ORDER — MELOXICAM 7.5 MG PO TABS: 7.5000 mg | ORAL_TABLET | Freq: Every day | ORAL | 0 refills | Status: DC

## 2020-03-13 MED ORDER — PREDNISONE 10 MG PO TABS: ORAL_TABLET | ORAL | 0 refills | Status: DC

## 2020-03-13 NOTE — Patient Instructions (Addendum)
Acute Knee Pain, Adult Many things can cause knee pain. Sometimes, knee pain is sudden (acute) and may be caused by damage, swelling, or irritation of the muscles and tissues that support your knee. The pain often goes away on its own with time and rest. If the pain does not go away, tests may be done to find out what is causing the pain. Follow these instructions at home: Pay attention to any changes in your symptoms. Take these actions to relieve your pain. If you have a knee sleeve or brace:   Wear the sleeve or brace as told by your doctor. Remove it only as told by your doctor.  Loosen the sleeve or brace if your toes: ? Tingle. ? Become numb. ? Turn cold and blue.  Keep the sleeve or brace clean.  If the sleeve or brace is not waterproof: ? Do not let it get wet. ? Cover it with a watertight covering when you take a bath or shower. Activity  Rest your knee.  Do not do things that cause pain.  Avoid activities where both feet leave the ground at the same time (high-impact activities). Examples are running, jumping rope, and doing jumping jacks.  Work with a physical therapist to make a safe exercise program, as told by your doctor. Managing pain, stiffness, and swelling   If told, put ice on the knee: ? Put ice in a plastic bag. ? Place a towel between your skin and the bag. ? Leave the ice on for 20 minutes, 2-3 times a day.  If told, put pressure (compression) on your injured knee to control swelling, give support, and help with discomfort. Compression may be done with an elastic bandage. General instructions  Take all medicines only as told by your doctor.  Raise (elevate) your knee while you are sitting or lying down. Make sure your knee is higher than your heart.  Sleep with a pillow under your knee.  Do not use any products that contain nicotine or tobacco. These include cigarettes, e-cigarettes, and chewing tobacco. These products may slow down healing. If  you need help quitting, ask your doctor.  If you are overweight, work with your doctor and a food expert (dietitian) to set goals to lose weight. Being overweight can make your knee hurt more.  Keep all follow-up visits as told by your doctor. This is important. Contact a doctor if:  The knee pain does not stop.  The knee pain changes or gets worse.  You have a fever along with knee pain.  Your knee feels warm when you touch it.  Your knee gives out or locks up. Get help right away if:  Your knee swells, and the swelling gets worse.  You cannot move your knee.  You have very bad knee pain. Summary  Many things can cause knee pain. The pain often goes away on its own with time and rest.  Your doctor may do tests to find out the cause of the pain.  Pay attention to any changes in your symptoms. Relieve your pain with rest, medicines, light activity, and use of ice.  Get help right away if you cannot move your knee or your knee pain is very bad. This information is not intended to replace advice given to you by your health care provider. Make sure you discuss any questions you have with your health care provider. Document Revised: 09/01/2017 Document Reviewed: 09/01/2017 Elsevier Patient Education  [place].   Bursitis  Bursitis  is inflammation and irritation of a bursa, which is one of the small, fluid-filled sacs that cushion and protect the moving parts of your body. These sacs are located between bones and muscles, bones and muscle attachments, or bones and skin areas that are next to bones. A bursa protects those structures from the wear and tear that results from frequent movement. An inflamed bursa causes pain and swelling. Fluid may build up inside the sac. Bursitis is most common near joints, especially the knees, elbows, hips, and shoulders. What are the causes? This condition may be caused by:  Injury from: ? A direct hit (blow), like falling on your  knee or elbow. ? Overuse of a joint (repetitive stress).  Infection. This can happen if bacteria get into a bursa through a cut or scrape near a joint.  Diseases that cause joint inflammation, such as gout and rheumatoid arthritis. What increases the risk? You are more likely to develop this condition if you:  Have a job or hobby that involves a lot of repetitive stress on your joints.  Have a condition that weakens your body's defense system (immune system), such as diabetes, cancer, or HIV.  Do any of these often: ? Lift and reach overhead. ? Kneel or lean on hard surfaces. ? Run or walk. What are the signs or symptoms? The most common symptoms of this condition include:  Pain that gets worse when you move the affected body part or use it to support (bear) your body weight.  Inflammation.  Stiffness. Other symptoms include:  Redness.  Swelling.  Tenderness.  Warmth.  Pain that continues after rest.  Fever or chills. These may occur in bursitis that is caused by infection. How is this diagnosed? This condition may be diagnosed based on:  Your medical history and a physical exam.  Imaging tests, such as an MRI.  A procedure to drain fluid from the bursa with a needle (aspiration). The fluid may be checked for signs of infection or gout.  Blood tests to rule out other causes of inflammation. How is this treated? This condition can usually be treated at home with rest, ice, applying pressure (compression), and raising the body part that is affected (elevation). This is called RICE therapy. For mild bursitis, RICE therapy may be all you need. Other treatments may include:  NSAIDs to treat pain and inflammation.  Corticosteroid medicines to fight inflammation. These medicines may be injected into and around the area of bursitis.  Aspiration of fluid from the bursa to relieve pain and improve movement.  Antibiotic medicine to treat an infected bursa.  A splint,  brace, or walking aid, such as a cane.  Physical therapy if you continue to have pain or limited movement.  Surgery to remove a damaged or infected bursa. This may be needed if other treatments have not worked. Follow these instructions at home: Medicines  Take over-the-counter and prescription medicines only as told by your health care provider.  If you were prescribed an antibiotic medicine, take it as told by your health care provider. Do not stop taking the antibiotic even if you start to feel better. General instructions   Rest the affected area as told by your health care provider. ? If possible, raise (elevate) the affected area above the level of your heart while you are sitting or lying down. ? Avoid activities that make pain worse.  Use splints, braces, pads, or walking aids as told by your health care provider.  If directed, put  ice on the affected area: ? If you have a removable splint or brace, remove it as told by your health care provider. ? Put ice in a plastic bag. ? Place a towel between your skin and the bag or between your splint or brace and the bag. ? Leave the ice on for 20 minutes, 2-3 times a day.  Keep all follow-up visits as told by your health care provider. This is important. Preventing future episodes Take actions to help prevent future episodes of bursitis.  Wear knee pads if you kneel often.  Wear sturdy running or walking shoes that fit you well.  Take breaks regularly from repetitive activity.  Warm up by stretching before doing any activity that takes a lot of effort.  Maintain a healthy weight or lose weight as recommended by your health care provider. If you need help doing this, ask your health care provider.  Exercise regularly. Start any new physical activity gradually. Contact a health care provider if you:  Have a fever.  Have chills.  Have bursitis that is not getting better with treatment or home care. Summary  Bursitis is  inflammation and irritation of a bursa, which is one of the small, fluid-filled sacs that cushion and protect the moving parts of your body.  An inflamed bursa causes pain and swelling.  Bursitis is commonly diagnosed with a physical exam, but other tests are sometimes needed.  This condition can usually be treated at home with rest, ice, applying pressure (compression), and raising the body part that is affected (elevation). This is called RICE therapy. This information is not intended to replace advice given to you by your health care provider. Make sure you discuss any questions you have with your health care provider. Document Revised: 03/04/2017 Document Reviewed: 02/04/2017 Elsevier Patient Education  2020 [employer].

## 2020-03-13 NOTE — Progress Notes (Signed)
Subjective:    Patient ID: Nancy Mcmahon, female    DOB: 04/05/1986, 34 y.o.   MRN: [id number]  No chief complaint on file.   HPI Patient is a 34 year old female with past medical history significant for allergies and anemia who was seen today for acute R knee pain.  Noted after increased standing while putting up Christmas decorations over the weekend.  Patient denies any pops, clicks, tears.  Endorses mild edema.  Past Medical History:  Diagnosis Date  . Allergy   . Anemia   . Childhood asthma     Allergies  Allergen Reactions  . Banana   . Fish Allergy   . Peanut-Containing Drug Products   . Shellfish Allergy   . Strawberry Flavor   . Watermelon Flavor   . Wheat Bran   . Penicillins Rash    ROS General: Denies fever, chills, night sweats, changes in weight, changes in appetite HEENT: Denies headaches, ear pain, changes in vision, rhinorrhea, sore throat CV: Denies CP, palpitations, SOB, orthopnea Pulm: Denies SOB, cough, wheezing GI: Denies abdominal pain, nausea, vomiting, diarrhea, constipation GU: Denies dysuria, hematuria, frequency, vaginal discharge Msk: Denies muscle cramps, joint pains  +R knee pain/edema Neuro: Denies weakness, numbness, tingling Skin: Denies rashes, bruising Psych: Denies depression, anxiety, hallucinations      Objective:    Blood pressure 110/74, pulse 84, temperature 98.4 F (36.9 C), temperature source Oral, weight 138 lb 3.2 oz (62.7 kg).  Gen. Pleasant, well-nourished, in no distress, normal affect   HEENT: [place]/AT, face symmetric, conjunctiva clear, no scleral icterus, PERRLA, EOMI, nares patent without drainage, Lungs: no accessory muscle use Cardiovascular: RRR, no peripheral edema Musculoskeletal: No crepitus of bilateral knees.  TTP of patellar tendon and medial right knee.  No deformities, no cyanosis or clubbing, normal tone Neuro:  A&Ox3, CN II-XII intact, normal gait Skin:  Warm, no lesions/ rash   Wt Readings from  Last 3 Encounters:  03/13/20 138 lb 3.2 oz (62.7 kg)  02/15/20 134 lb 6.4 oz (61 kg)  02/13/20 132 lb 11.2 oz (60.2 kg)    Lab Results  Component Value Date   WBC 5.3 02/08/2020   HGB 7.7 (L) 02/08/2020   HCT 25.6 (L) 02/08/2020   PLT 377 02/08/2020   GLUCOSE 78 02/08/2020   CHOL 165 08/13/2016   TRIG 49 08/13/2016   HDL 78 (A) 08/13/2016   LDLCALC 77 08/13/2016   ALT 8 02/08/2020   AST 15 02/08/2020   NA 136 02/08/2020   K 4.5 02/08/2020   CL 102 02/08/2020   CREATININE 0.66 02/08/2020   BUN 10 02/08/2020   CO2 26 02/08/2020   TSH 1.57 02/08/2020    Assessment/Plan:  Acute pain of right knee -Discussed likely 2/2 tendinitis versus muscle strain from overuse -Discussed supportive care including ice, heat, massage, stretching, rest, compression, topical analgesics -Right knee wrapped while in clinic. -For continued or worsening symptoms consider imaging. -Given precautions - Plan: meloxicam (MOBIC) 7.5 MG tablet  Tendonitis of knee, right -Likely 2/2 overuse -Continue supportive care - Plan: predniSONE (DELTASONE) 10 MG tablet  F/u as needed  Grier Mitts, MD

## 2020-03-17 ENCOUNTER — Inpatient Hospital Stay: Payer: 59

## 2020-03-17 ENCOUNTER — Other Ambulatory Visit: Payer: Self-pay

## 2020-03-17 VITALS — BP 115/70 | HR 80 | Temp 97.6°F | Resp 18

## 2020-03-17 DIAGNOSIS — D509 Iron deficiency anemia, unspecified: Secondary | ICD-10-CM | POA: Diagnosis not present

## 2020-03-17 MED ORDER — SODIUM CHLORIDE 0.9 % IV SOLN
Freq: Once | INTRAVENOUS | Status: AC
  Filled 2020-03-17: qty 250

## 2020-03-17 MED ORDER — IRON SUCROSE 20 MG/ML IV SOLN
200.0000 mg | Freq: Once | INTRAVENOUS | Status: AC
  Administered 2020-03-17: 11:00:00 200 mg via INTRAVENOUS
  Filled 2020-03-17: qty 200

## 2020-03-17 NOTE — Patient Instructions (Signed)

## 2020-03-21 ENCOUNTER — Telehealth: Payer: Self-pay | Admitting: Hematology and Oncology

## 2020-03-21 NOTE — Telephone Encounter (Signed)
R/s 1/31 appt per provider PAL. Called, not able to leave msg. Mailed printout

## 2020-03-24 ENCOUNTER — Inpatient Hospital Stay: Payer: 59

## 2020-03-26 ENCOUNTER — Inpatient Hospital Stay: Payer: 59

## 2020-04-01 ENCOUNTER — Other Ambulatory Visit: Payer: Self-pay

## 2020-04-01 ENCOUNTER — Inpatient Hospital Stay: Payer: 59

## 2020-04-01 VITALS — BP 106/75 | HR 74 | Temp 99.1°F | Resp 18

## 2020-04-01 DIAGNOSIS — D509 Iron deficiency anemia, unspecified: Secondary | ICD-10-CM | POA: Diagnosis not present

## 2020-04-01 MED ORDER — SODIUM CHLORIDE 0.9 % IV SOLN
Freq: Once | INTRAVENOUS | Status: AC
  Filled 2020-04-01: qty 250

## 2020-04-01 MED ORDER — SODIUM CHLORIDE 0.9 % IV SOLN
200.0000 mg | Freq: Once | INTRAVENOUS | Status: AC
  Administered 2020-04-01: 14:00:00 200 mg via INTRAVENOUS
  Filled 2020-04-01: qty 200

## 2020-04-01 NOTE — Progress Notes (Signed)
Pt. refused to wait 30 min post infusion. Pt discharged in no apparent distress. Pt left ambulatory without assistance. Pt aware of discharge instructions and verbalized understanding and had no further questions. Stable and asymptomatic upon discharge.  

## 2020-04-01 NOTE — Patient Instructions (Signed)

## 2020-04-09 ENCOUNTER — Other Ambulatory Visit: Payer: Self-pay | Admitting: Family Medicine

## 2020-04-09 DIAGNOSIS — M25561 Pain in right knee: Secondary | ICD-10-CM

## 2020-04-09 NOTE — Telephone Encounter (Signed)
Pt LOV was on 03/13/2020 and Rx was prescribed the same day, Pt has an appointment with  her PCP on 05/01/2019. Please advise if ok for refill

## 2020-04-11 ENCOUNTER — Encounter: Payer: Self-pay | Admitting: Family Medicine

## 2020-04-29 ENCOUNTER — Other Ambulatory Visit: Payer: Self-pay

## 2020-04-30 ENCOUNTER — Other Ambulatory Visit (HOSPITAL_COMMUNITY)
Admission: RE | Admit: 2020-04-30 | Discharge: 2020-04-30 | Disposition: A | Discharge status: Home / Self Care | Payer: 59 | Source: Ambulatory Visit | Attending: Family Medicine | Admitting: Family Medicine

## 2020-04-30 ENCOUNTER — Ambulatory Visit (INDEPENDENT_AMBULATORY_CARE_PROVIDER_SITE_OTHER): Payer: 59

## 2020-04-30 ENCOUNTER — Ambulatory Visit (INDEPENDENT_AMBULATORY_CARE_PROVIDER_SITE_OTHER): Payer: 59 | Admitting: Family Medicine

## 2020-04-30 ENCOUNTER — Encounter: Payer: Self-pay | Admitting: Family Medicine

## 2020-04-30 VITALS — BP 100/56 | HR 77 | Temp 98.1°F | Ht 62.0 in | Wt 140.8 lb

## 2020-04-30 DIAGNOSIS — Z13220 Encounter for screening for lipoid disorders: Secondary | ICD-10-CM | POA: Diagnosis not present

## 2020-04-30 DIAGNOSIS — Z124 Encounter for screening for malignant neoplasm of cervix: Secondary | ICD-10-CM | POA: Insufficient documentation

## 2020-04-30 DIAGNOSIS — Z0000 Encounter for general adult medical examination without abnormal findings: Secondary | ICD-10-CM | POA: Diagnosis not present

## 2020-04-30 DIAGNOSIS — Z113 Encounter for screening for infections with a predominantly sexual mode of transmission: Secondary | ICD-10-CM

## 2020-04-30 DIAGNOSIS — D500 Iron deficiency anemia secondary to blood loss (chronic): Secondary | ICD-10-CM

## 2020-04-30 DIAGNOSIS — M25561 Pain in right knee: Secondary | ICD-10-CM

## 2020-04-30 LAB — CBC WITH DIFFERENTIAL/PLATELET
Basophils Absolute: 0 10*3/uL (ref 0.0–0.1)
Basophils Relative: 0.6 % (ref 0.0–3.0)
Eosinophils Absolute: 0.2 10*3/uL (ref 0.0–0.7)
Eosinophils Relative: 2.8 % (ref 0.0–5.0)
HCT: 36.2 % (ref 36.0–46.0)
Hemoglobin: 12 g/dL (ref 12.0–15.0)
Lymphocytes Relative: 28.6 % (ref 12.0–46.0)
Lymphs Abs: 1.7 10*3/uL (ref 0.7–4.0)
MCHC: 33 g/dL (ref 30.0–36.0)
MCV: 85.8 fl (ref 78.0–100.0)
Monocytes Absolute: 0.4 10*3/uL (ref 0.1–1.0)
Monocytes Relative: 7.4 % (ref 3.0–12.0)
Neutro Abs: 3.6 10*3/uL (ref 1.4–7.7)
Neutrophils Relative %: 60.6 % (ref 43.0–77.0)
Platelets: 210 10*3/uL (ref 150.0–400.0)
RBC: 4.22 Mil/uL (ref 3.87–5.11)
RDW: 22.7 % — ABNORMAL HIGH (ref 11.5–15.5)
WBC: 6 10*3/uL (ref 4.0–10.5)

## 2020-04-30 LAB — LIPID PANEL
Cholesterol: 145 mg/dL (ref 0–200)
HDL: 54.7 mg/dL (ref >39.00)
LDL Cholesterol: 77 mg/dL (ref 0–99)
NonHDL: 89.91
Total CHOL/HDL Ratio: 3
Triglycerides: 66 mg/dL (ref 0.0–149.0)
VLDL: 13.2 mg/dL (ref 0.0–40.0)

## 2020-04-30 NOTE — Progress Notes (Signed)
Nancy Mcmahon DOB: 06-12-1985 Encounter date: 04/30/2020  This is a 35 y.o. female who presents for complete physical   History of present illness/Additional concerns: Worried about knee. Still having pain in knee since last visit. Right knee stiff, hard to move around. Started in first week of December. Was decorating house for [place]. Has been limping, hurting since then. No obvious injury. Had some swelling initially. Did use ice, ibuprofen. Now she can walk but hurts. Hurts more with walking than at rest. Worse up stairs. If she does more it hurts more. Pain has improved. No prior injury. Initially was under knee; now more over knee. Ibuprofen as needed. Pain 3/10. Worse by end of day.   Feeling much better after iron replacement.   Past Medical History:  Diagnosis Date  . Allergy   . Anemia   . Childhood asthma    Past Surgical History:  Procedure Laterality Date  . ADENOIDECTOMY    . TONSILLECTOMY    . TUBAL LIGATION    . TUBAL LIGATION     Allergies  Allergen Reactions  . Banana   . Fish Allergy   . Peanut-Containing Drug Products   . Shellfish Allergy   . Strawberry Flavor   . Watermelon Flavor   . Wheat Bran   . Penicillins Rash   Current Meds  Medication Sig  . EPINEPHrine (AUVI-Q) 0.3 mg/0.3 mL IJ SOAJ injection Inject 0.3 mg into the muscle as needed for anaphylaxis.  Marland Kitchen metroNIDAZOLE (FLAGYL) 500 MG tablet Take 1 tablet (500 mg total) by mouth 2 (two) times daily.  . Multiple Vitamin (MULTIVITAMIN ADULT PO) Take by mouth.   Social History   Tobacco Use  . Smoking status: Never Smoker  . Smokeless tobacco: Never Used  Substance Use Topics  . Alcohol use: No    Alcohol/week: 0.0 standard drinks   Family History  Problem Relation Age of Onset  . Seizures Mother   . Allergic rhinitis Mother   . Cirrhosis Father   . Crohn's disease Son   . Allergic rhinitis Son   . Cancer Maternal Grandfather        stomach  . Allergic rhinitis Daughter   .  Allergic rhinitis Daughter      Review of Systems  Constitutional: Negative for activity change, appetite change, chills, fatigue, fever and unexpected weight change.  HENT: Negative for congestion, ear pain, hearing loss, sinus pressure, sinus pain, sore throat and trouble swallowing.   Eyes: Negative for pain and visual disturbance.  Respiratory: Negative for cough, chest tightness, shortness of breath and wheezing.   Cardiovascular: Negative for chest pain, palpitations and leg swelling.  Gastrointestinal: Negative for abdominal pain, blood in stool, constipation, diarrhea, nausea and vomiting.  Genitourinary: Negative for difficulty urinating and menstrual problem.  Musculoskeletal: Positive for arthralgias (right knee pain). Negative for back pain.  Skin: Negative for rash.  Neurological: Negative for dizziness, weakness, numbness and headaches.  Hematological: Negative for adenopathy. Does not bruise/bleed easily.  Psychiatric/Behavioral: Negative for sleep disturbance and suicidal ideas. The patient is not nervous/anxious.     CBC:  Lab Results  Component Value Date   WBC 6.0 04/30/2020   HGB 12.0 04/30/2020   HCT 36.2 04/30/2020   MCH 21.8 (L) 02/08/2020   MCHC 33.0 04/30/2020   RDW 22.7 (H) 04/30/2020   PLT 210.0 04/30/2020   MPV 10.6 02/08/2020   MPV 7.8 08/12/2016   CMP: Lab Results  Component Value Date   NA 136 02/08/2020  NA 138 08/13/2016   K 4.5 02/08/2020   CL 102 02/08/2020   CO2 26 02/08/2020   GLUCOSE 78 02/08/2020   BUN 10 02/08/2020   BUN 8 08/13/2016   CREATININE 0.66 02/08/2020   LABGLOB 3.2 08/12/2016   GFRAA 106 08/12/2016   CALCIUM 9.3 02/08/2020   PROT 7.6 02/08/2020   PROT 7.7 08/12/2016   AGRATIO 1.4 08/12/2016   BILITOT 1.0 02/08/2020   BILITOT 0.9 08/12/2016   ALKPHOS 36 (L) 06/16/2018   ALT 8 02/08/2020   AST 15 02/08/2020   LIPID: Lab Results  Component Value Date   CHOL 145 04/30/2020   CHOL 165 08/12/2016   TRIG 66.0  04/30/2020   HDL 54.70 04/30/2020   HDL 78 08/12/2016   LDLCALC 77 04/30/2020   LDLCALC 77 08/12/2016   LABVLDL 10 08/12/2016    Objective:  BP (!) 100/56 (BP Location: Left Arm, Patient Position: Sitting, Cuff Size: Normal)   Pulse 77   Temp 98.1 F (36.7 C) (Oral)   Ht 5\' 2"  (1.575 m)   Wt 140 lb 12.8 oz (63.9 kg)   LMP 04/12/2020 (Exact Date)   SpO2 97%   BMI 25.75 kg/m   Weight: 140 lb 12.8 oz (63.9 kg)   BP Readings from Last 3 Encounters:  04/30/20 (!) 100/56  04/01/20 106/75  03/17/20 115/70   Wt Readings from Last 3 Encounters:  04/30/20 140 lb 12.8 oz (63.9 kg)  03/13/20 138 lb 3.2 oz (62.7 kg)  02/15/20 134 lb 6.4 oz (61 kg)    Physical Exam Exam conducted with a chaperone present.  Constitutional:      General: She is not in acute distress.    Appearance: She is well-developed and well-nourished.  HENT:     Head: Normocephalic and atraumatic.     Right Ear: External ear normal.     Left Ear: External ear normal.     Mouth/Throat:     Mouth: Oropharynx is clear and moist.     Pharynx: No oropharyngeal exudate.  Eyes:     Conjunctiva/sclera: Conjunctivae normal.     Pupils: Pupils are equal, round, and reactive to light.  Neck:     Thyroid: No thyromegaly.  Cardiovascular:     Rate and Rhythm: Normal rate and regular rhythm.     Heart sounds: Normal heart sounds. No murmur heard. No friction rub. No gallop.   Pulmonary:     Effort: Pulmonary effort is normal.     Breath sounds: Normal breath sounds.  Abdominal:     General: Bowel sounds are normal. There is no distension.     Palpations: Abdomen is soft. There is no mass.     Tenderness: There is no abdominal tenderness. There is no guarding.     Hernia: No hernia is present.  Genitourinary:    Exam position: Supine.     Vagina: Normal.     Cervix: Normal.     Uterus: Normal.      Adnexa: Right adnexa normal and left adnexa normal.  Musculoskeletal:        General: No tenderness, deformity  or edema. Normal range of motion.     Cervical back: Normal range of motion and neck supple.     Comments: Mild edema right knee. There is tenderness supra and infrapatellar. No laxity appreciated of knee.   Lymphadenopathy:     Cervical: No cervical adenopathy.  Skin:    General: Skin is warm and dry.  Findings: No rash.  Neurological:     Mental Status: She is alert and oriented to person, place, and time.     Deep Tendon Reflexes: Strength normal. Reflexes normal.     Reflex Scores:      Tricep reflexes are 2+ on the right side and 2+ on the left side.      Bicep reflexes are 2+ on the right side and 2+ on the left side.      Brachioradialis reflexes are 2+ on the right side and 2+ on the left side.      Patellar reflexes are 2+ on the right side and 2+ on the left side. Psychiatric:        Mood and Affect: Mood and affect normal.        Speech: Speech normal.        Behavior: Behavior normal.        Thought Content: Thought content normal.     Assessment/Plan: Health Maintenance Due  Topic Date Due  . COVID-19 Vaccine (3 - Booster for Pfizer series) 02/13/2020   Health Maintenance reviewed.  1. Preventative health care Keep up with healthy lifestyle.   2. Acute pain of right knee Will get xray today but due to duration of pain would like ortho opinion. - DG Knee Complete 4 Views Right; Future - Ambulatory referral to Orthopedics  3. Iron deficiency anemia due to chronic blood loss Feels better after iron infusion. We discussed consideration for options to help with period control (duration/heaviness) and she will consider.  - CBC with Differential/Platelet; Future - Iron, TIBC and Ferritin Panel; Future - Iron, TIBC and Ferritin Panel - CBC with Differential/Platelet  4. Lipid screening - Lipid panel; Future - Lipid panel  5. Screen for STD (sexually transmitted disease) - Cervicovaginal ancillary only  6. Cervical cancer screening - PAP [[facility]]  Return for pending labs, xray.  Micheline Rough, MD

## 2020-05-01 LAB — IRON,TIBC AND FERRITIN PANEL
%SAT: 37 % (calc) (ref 16–45)
Ferritin: 37 ng/mL (ref 16–154)
Iron: 129 ug/dL (ref 40–190)
TIBC: 351 mcg/dL (calc) (ref 250–450)

## 2020-05-01 LAB — CERVICOVAGINAL ANCILLARY ONLY
Bacterial Vaginitis (gardnerella): POSITIVE — AB
Candida Glabrata: NEGATIVE
Candida Vaginitis: NEGATIVE
Chlamydia: NEGATIVE
Comment: NEGATIVE
Comment: NEGATIVE
Comment: NEGATIVE
Comment: NEGATIVE
Comment: NEGATIVE
Comment: NORMAL
Neisseria Gonorrhea: NEGATIVE
Trichomonas: NEGATIVE

## 2020-05-02 LAB — CYTOLOGY - PAP
Comment: NEGATIVE
Diagnosis: UNDETERMINED — AB
High risk HPV: NEGATIVE

## 2020-05-02 MED ORDER — METRONIDAZOLE 500 MG PO TABS: 500.0000 mg | ORAL_TABLET | Freq: Two times a day (BID) | ORAL | 0 refills | Status: DC

## 2020-05-05 ENCOUNTER — Other Ambulatory Visit: Payer: 59

## 2020-05-05 ENCOUNTER — Ambulatory Visit: Payer: 59 | Admitting: Hematology and Oncology

## 2020-05-06 ENCOUNTER — Other Ambulatory Visit: Payer: Self-pay | Admitting: Hematology and Oncology

## 2020-05-06 DIAGNOSIS — D509 Iron deficiency anemia, unspecified: Secondary | ICD-10-CM

## 2020-05-07 ENCOUNTER — Other Ambulatory Visit: Payer: 59

## 2020-05-07 ENCOUNTER — Ambulatory Visit: Payer: 59 | Admitting: Hematology and Oncology

## 2020-05-07 ENCOUNTER — Other Ambulatory Visit: Payer: Self-pay | Admitting: Family Medicine

## 2020-05-07 DIAGNOSIS — N924 Excessive bleeding in the premenopausal period: Secondary | ICD-10-CM

## 2020-05-12 ENCOUNTER — Ambulatory Visit: Payer: 59 | Admitting: Orthopaedic Surgery

## 2020-05-20 ENCOUNTER — Encounter: Payer: Self-pay | Admitting: Nurse Practitioner

## 2020-05-20 ENCOUNTER — Ambulatory Visit: Payer: 59 | Admitting: Nurse Practitioner

## 2020-05-20 ENCOUNTER — Other Ambulatory Visit: Payer: Self-pay

## 2020-05-20 VITALS — BP 112/78 | Ht 62.0 in | Wt 142.0 lb

## 2020-05-20 DIAGNOSIS — N921 Excessive and frequent menstruation with irregular cycle: Secondary | ICD-10-CM

## 2020-05-20 DIAGNOSIS — Z862 Personal history of diseases of the blood and blood-forming organs and certain disorders involving the immune mechanism: Secondary | ICD-10-CM

## 2020-05-20 NOTE — Patient Instructions (Signed)

## 2020-05-20 NOTE — Progress Notes (Signed)
   Acute Office Visit  Subjective:    Patient ID: Nancy Mcmahon, female    DOB: 06/02/1985, 35 y.o.   MRN: [id number]   HPI 35 y.o. G3P0003 presents as new patient to discuss heavy menses. She has had heavy cycles for many years but they are getting more frequent and more heavy. Most recently she had cycle 2/1 and 2/10. Bleeding is heavy with clots the first few days and total bleeding time is around 7 days. Her bleeding since 2/10 has been much lighter than a normal cycle. History of anemia requiring transfusions, most recently she had weekly transfusions x 6 weeks, hemoglobin 12 on 05/06/2020. Normal pap history - 04/30/2020 ASCUS negative HPV. Unremarkable pelvic ultrasound 02/20/2020. BTL.    Review of Systems  Constitutional: Negative.   Gastrointestinal: Negative.   Genitourinary: Positive for menstrual problem and vaginal bleeding. Negative for vaginal discharge and vaginal pain.       Objective:    Physical Exam Constitutional:      Appearance: Normal appearance.   GU: declined by patient - on menses  BP 112/78 (BP Location: Right Arm, Patient Position: Sitting, Cuff Size: Normal)   Ht 5\' 2"  (1.575 m)   Wt 142 lb (64.4 kg)   LMP 05/18/2020   BMI 25.97 kg/m  Wt Readings from Last 3 Encounters:  05/20/20 142 lb (64.4 kg)  04/30/20 140 lb 12.8 oz (63.9 kg)  03/13/20 138 lb 3.2 oz (62.7 kg)        Assessment & Plan:   Problem List Items Addressed This Visit   None   Visit Diagnoses    Menorrhagia with irregular cycle    -  Primary   Relevant Orders   IUD Insertion   History of anemia         Plan: We discussed long history of heavy cycles resulting in anemia and option for management with hormone manipulation. Options were reviewed, including both combination (pill, patch, vaginal ring) and progesterone-only (pill, Depo Provera and Nexplanon), intrauterine devices (Mirena, [place], [place], and Annetta). The mechanisms, risks, benefits and side effects of all  methods were discussed. She does not want pills as she feels she will not remember to take. LARC recommended for better cycle control. She is interested in the [place] IUD. We will schedule her for insertion with Dr. Dellis Filbert. We also discussed that if heavy bleeding continues with IUD she may be a good candidate for an endometrial ablation. All questions answered.       Tamela Gammon [facility], 11:32 AM 05/20/2020

## 2020-05-23 ENCOUNTER — Ambulatory Visit: Payer: 59 | Admitting: Family Medicine

## 2020-05-26 ENCOUNTER — Ambulatory Visit: Payer: 59 | Admitting: Nurse Practitioner

## 2020-05-26 ENCOUNTER — Encounter: Payer: Self-pay | Admitting: Nurse Practitioner

## 2020-05-26 ENCOUNTER — Other Ambulatory Visit: Payer: Self-pay

## 2020-05-26 VITALS — BP 118/78 | HR 100 | Resp 16 | Wt 137.0 lb

## 2020-05-26 DIAGNOSIS — Z01812 Encounter for preprocedural laboratory examination: Secondary | ICD-10-CM

## 2020-05-26 DIAGNOSIS — N921 Excessive and frequent menstruation with irregular cycle: Secondary | ICD-10-CM | POA: Diagnosis not present

## 2020-05-26 DIAGNOSIS — Z30011 Encounter for initial prescription of contraceptive pills: Secondary | ICD-10-CM

## 2020-05-26 LAB — PREGNANCY, URINE: Preg Test, Ur: NEGATIVE

## 2020-05-26 MED ORDER — LEVONORGESTREL-ETHINYL ESTRAD 0.1-20 MG-MCG PO TABS: 1.0000 | ORAL_TABLET | Freq: Every day | ORAL | 3 refills | Status: DC

## 2020-05-26 NOTE — Progress Notes (Signed)
  35 y.o. G3P0068female presents for insertion of Mirena.    Pt has been counseled about risks and benefits as well as complications.  Consent is obtained today.  Pt mentioned that had two periods in the month of February. Both seemed as heavy as regular periods.  Last US done 02/2020.  Menstrual history: Had a 7 day period on 05/06/2020 Started bleeding again 05/19/2020- started spotting "Full on period" 05/21/2020 and had 2 days of heavy bleeding.  Previous period was 04/12/2020, lasting 5 days  In December 03/18/2021, lasting 7 days  Periods have always been relatively heavy  Pt reports a lot of stress right now. Pt states husband of 17 years just told her this weekend that he wanted a divorce. It came out of the blue. She does not think it is related to infidelity and declines STD testing prior to IUD.  Pt is not sure she wants to go through with IUD insertion today she wants to think about it more. May repeat US prior to IUD insertion to re-check endometrium. Discussed options, although forgets pills sometimes, want to try OCP.  Hemoglobin is normal now, but had gotten as low as 7.9, now is up to 12.6  Current contraception: BTL Last STD testing:  N/A, declines LMP:  Patient's last menstrual period was 05/21/2020.  Patient Active Problem List   Diagnosis Date Noted  . Fatigue 06/16/2018  . Iron deficiency anemia 08/12/2016  . Lower abdominal pain 08/12/2016  . Preventative health care 04/17/2011   Past Medical History:  Diagnosis Date  . Allergy   . Anemia   . Childhood asthma    Current Outpatient Medications on File Prior to Visit  Medication Sig Dispense Refill  . Multiple Vitamin (MULTIVITAMIN ADULT PO) Take by mouth.    . EPINEPHrine (AUVI-Q) 0.3 mg/0.3 mL IJ SOAJ injection Inject 0.3 mg into the muscle as needed for anaphylaxis. (Patient not taking: Reported on 05/26/2020) 1 each 1   No current facility-administered medications on file prior to visit.   Banana, Fish  allergy, Peanut-containing drug products, Shellfish allergy, Strawberry flavor, Watermelon flavor, Wheat bran, and Penicillins  ROS Vitals:   05/26/20 1014  Weight: 137 lb (62.1 kg)    Gen:  [facility] healthy female NAD Heart: regular rate and rhythm, no murmur Lungs: Clear to auscultation  Assessment/Plan: Encounter for preprocedural laboratory examination - Plan: Pregnancy, urine Pt decided not to go through with IUD insertion today  Menorrhagia with irregular cycle -levonorgestrel-ethinyl estradiol (ALESSE) 0.1-20 MG-MCG tablet. Will start OCP first (take continuous) and plan Pelvic Ultrasound in 1 month. May at that time decide to proceed with IUD insertion (Mirena).  OCP (oral contraceptive pills) initiation - Plan: levonorgestrel-ethinyl estradiol (ALESSE) 0.1-20 MG-MCG tablet

## 2020-05-30 ENCOUNTER — Telehealth: Payer: Self-pay | Admitting: *Deleted

## 2020-05-30 NOTE — Telephone Encounter (Signed)
Patient was sen on 05/26/20 prescribed Alesse 0.1-20 mg-mcg tablet, patient said you told her to start that day,however she did not. Reports she is still having brown discharge when wiping, and notices small amount on tissue. She is under a lot of stress, reports she was nervous to start the pills afraid it would cause "side effects". She would like to know if okay to start the pills now? I told her I think that would be fine, but I would double check with you. I did explain normally the start date is while on cycle. Please advise

## 2020-06-26 ENCOUNTER — Ambulatory Visit (INDEPENDENT_AMBULATORY_CARE_PROVIDER_SITE_OTHER): Payer: 59

## 2020-06-26 ENCOUNTER — Other Ambulatory Visit: Payer: Self-pay

## 2020-06-26 ENCOUNTER — Encounter: Payer: Self-pay | Admitting: Obstetrics and Gynecology

## 2020-06-26 ENCOUNTER — Ambulatory Visit: Payer: 59 | Admitting: Obstetrics and Gynecology

## 2020-06-26 VITALS — BP 122/72 | HR 96 | Ht 62.0 in | Wt 137.0 lb

## 2020-06-26 DIAGNOSIS — N921 Excessive and frequent menstruation with irregular cycle: Secondary | ICD-10-CM

## 2020-06-26 DIAGNOSIS — N840 Polyp of corpus uteri: Secondary | ICD-10-CM | POA: Diagnosis not present

## 2020-06-26 DIAGNOSIS — Z862 Personal history of diseases of the blood and blood-forming organs and certain disorders involving the immune mechanism: Secondary | ICD-10-CM

## 2020-06-26 NOTE — Progress Notes (Signed)
GYNECOLOGY  VISIT   HPI: 35 y.o.   Married Black or [place] American Not Hispanic or Latino  female   [phone] with Patient's last menstrual period was 06/21/2020.   here for vaginal ultrasound consult. She states that her last period was 5 days long what was heavy.    The patient has heavy vaginal bleeding leading to severe anemia. She had a hgb of 7.7 4 months ago and has been undergoing iron transfusions with hematology. Last Hgb in 1/22 was 12.  Pap 1.26.22 ASCUS, negative HPV She was started on OCP's last month, not taking them yet.   Most of the time she is having a cycle every 3-3.5 weeks x 5-8 days (has been as 16 days). She is saturating a super + tampon within an hour (sometimes 45 minutes), no change. Passes clots. Cramps are horrible. She had some intermenstrual spotting in February, has spotting on occasion.  Sexually active, no post coital bleeding.    GYNECOLOGIC HISTORY: Patient's last menstrual period was 06/21/2020. Contraception: BTL Menopausal hormone therapy: none         OB History    Gravida  3   Para      Term      Preterm      AB  0   Living  3     SAB  0   IAB      Ectopic  0   Multiple      Live Births                 Patient Active Problem List   Diagnosis Date Noted  . Fatigue 06/16/2018  . Iron deficiency anemia 08/12/2016  . Lower abdominal pain 08/12/2016  . Preventative health care 04/17/2011    Past Medical History:  Diagnosis Date  . Allergy   . Anemia   . Childhood asthma     Past Surgical History:  Procedure Laterality Date  . ADENOIDECTOMY    . TONSILLECTOMY    . TUBAL LIGATION    . TUBAL LIGATION      Current Outpatient Medications  Medication Sig Dispense Refill  . EPINEPHrine (AUVI-Q) 0.3 mg/0.3 mL IJ SOAJ injection Inject 0.3 mg into the muscle as needed for anaphylaxis. 1 each 1  . Multiple Vitamin (MULTIVITAMIN ADULT PO) Take by mouth.     No current facility-administered medications for this visit.      ALLERGIES: Banana, Fish allergy, Peanut-containing drug products, Shellfish allergy, Strawberry flavor, Watermelon flavor, Wheat bran, and Penicillins  Family History  Problem Relation Age of Onset  . Seizures Mother   . Allergic rhinitis Mother   . Cirrhosis Father   . Crohn's disease Son   . Allergic rhinitis Son   . Cancer Maternal Grandfather        stomach  . Allergic rhinitis Daughter   . Allergic rhinitis Daughter     Social History   Socioeconomic History  . Marital status: Married    Spouse name: Not on file  . Number of children: Not on file  . Years of education: Not on file  . Highest education level: Not on file  Occupational History  . Not on file  Tobacco Use  . Smoking status: Never Smoker  . Smokeless tobacco: Never Used  Vaping Use  . Vaping Use: Never used  Substance and Sexual Activity  . Alcohol use: No    Alcohol/week: 0.0 standard drinks  . Drug use: No  . Sexual activity: Yes  Partners: Male    Birth control/protection: None  Other Topics Concern  . Not on file  Social History Narrative  . Not on file   Social Determinants of Health   Financial Resource Strain: Not on file  Food Insecurity: Not on file  Transportation Needs: Not on file  Physical Activity: Not on file  Stress: Not on file  Social Connections: Not on file  Intimate Partner Violence: Not on file    ROS  PHYSICAL EXAMINATION:    BP 122/72   Pulse 96   Ht 5\' 2"  (1.575 m)   Wt 137 lb (62.1 kg)   LMP 06/21/2020   SpO2 93%   BMI 25.06 kg/m     General appearance: alert, cooperative and appears stated age Neck: no adenopathy, supple, symmetrical, trachea midline and thyroid normal to inspection and palpation Heart: regular rate and rhythm Lungs: CTAB Abdomen: soft, non-tender; bowel sounds normal; no masses,  no organomegaly Extremities: normal, atraumatic, no cyanosis Skin: normal color, texture and turgor, no rashes or lesions Lymph: normal cervical  supraclavicular and inguinal nodes Neurologic: grossly normal  Pelvic ultrasound  Indications: menorrhagia, intermenstrual spotting, anemia  Findings:  Uterus 7.9 x 4.3 x 4.05 cm  Endometrium 10.15 mm  8 mm echogenic focus near the fundus, feeder vessel seen, suspect endometrial polyp  Left ovary 3.64 x 1.64 x 1.39 cm  Right ovary 2.88 x 2.14 x 1.76 cm  Small amount of free fluid in the left adnexa   Impression:  Normal sized retroverted uterus Thickened endometrium, focus suspicious for endometrial polyp Normal adnexa bilaterally   1. Menorrhagia with irregular cycle, some intermenstrual spotting.  Leading to severe anemia. Needing iron transfusions.  Discussed option of sonhysterogram to confirm polyp or proceeding with hysteroscopy Discussed the mirena IUD to treat her menorrhagia Plan: hysteroscopy, polypectomy, dilation and curettage and mirena IUD insertion. Reviewed risks, including: bleeding, infection, uterine perforation, fluid overload, need for further sugery   2. History of anemia As above  3. Endometrial polyp Suspected polyp on ultrasound  In addition to reviewing the ultrasound, over 15 minutes was spent in total patient care including discussing management options and planning for surgery.

## 2020-07-01 ENCOUNTER — Telehealth: Payer: Self-pay | Admitting: *Deleted

## 2020-07-01 ENCOUNTER — Encounter: Payer: Self-pay | Admitting: Obstetrics and Gynecology

## 2020-07-01 NOTE — Telephone Encounter (Signed)
Spoke with patient.  Reviewed surgery dates and Covid quarantine requirements. Patient request to proceed with surgery on 07/28/20.  Advised patient I will schedule and return call once confirmed. Patient verbalizes understanding and is agreeable.   Routing to [employer] for [employer].

## 2020-07-02 NOTE — Telephone Encounter (Signed)
Call to patient. Per DPR, OK to leave message on voicemail.   Left voicemail requesting a return call to [facility] to review benefits for scheduled surgery with Sumner Boast, MD.

## 2020-07-07 NOTE — Telephone Encounter (Signed)
Left message to call Ritvik Mczeal, RN at [facility], [phone].  

## 2020-07-09 NOTE — Telephone Encounter (Signed)
Call to patient. Per DPR, OK to leave message on voicemail.  Left voicemail requesting a return call to [facility] to review benefits for scheduled surgery with Sumner Boast, MD.

## 2020-07-11 NOTE — Telephone Encounter (Signed)
Spoke with patient. Called to confirm surgery date of 07/28/20. Patient states she is unable to talk at this time, will return call later.

## 2020-07-14 NOTE — Telephone Encounter (Signed)
Spoke with patient. Called to confirm surgery date and review pre-op instructions. Patient states now is not a good time, she will return call later today.

## 2020-07-15 NOTE — Telephone Encounter (Signed)
Call to patient. Per DPR, OK to leave message on voicemail.  Left voicemail requesting a return call to [facility] to review benefitsforscheduled surgerywith Sumner Boast, MD.

## 2020-07-16 NOTE — Telephone Encounter (Signed)
Call to patient. Per DPR, OK to leave message on voicemail.  Left voicemail requesting a return call to [facility] to review benefitsforscheduled surgerywith Sumner Boast, MD.

## 2020-07-17 NOTE — Telephone Encounter (Signed)
No return call from patient.   MyChart message to patient.    

## 2020-07-22 NOTE — Telephone Encounter (Addendum)
No return call from patient.   Call placed to OR Central Scheduling, spoke with [facility]. Surgery cancelled for 07/28/20 due to no response from patient.   Routing to Dr. Talbert Nan for final review.

## 2020-07-28 ENCOUNTER — Encounter (HOSPITAL_BASED_OUTPATIENT_CLINIC_OR_DEPARTMENT_OTHER): Admission: RE | Payer: Self-pay | Source: Home / Self Care

## 2020-07-28 ENCOUNTER — Ambulatory Visit (HOSPITAL_BASED_OUTPATIENT_CLINIC_OR_DEPARTMENT_OTHER): Admission: RE | Admit: 2020-07-28 | Payer: 59 | Source: Home / Self Care | Admitting: Obstetrics and Gynecology

## 2020-07-28 SURGERY — DILATATION AND CURETTAGE /HYSTEROSCOPY
Anesthesia: Choice

## 2020-08-14 ENCOUNTER — Ambulatory Visit: Payer: 59 | Admitting: Allergy

## 2020-08-14 ENCOUNTER — Telehealth: Payer: Self-pay

## 2020-08-14 DIAGNOSIS — J309 Allergic rhinitis, unspecified: Secondary | ICD-10-CM

## 2020-08-14 NOTE — Telephone Encounter (Signed)
Called and spoke to patient to inform her of her missed appointment. Patient was aware that she missed her appointment and declined to reschedule.

## 2021-04-14 ENCOUNTER — Encounter: Payer: Self-pay | Admitting: Hematology and Oncology

## 2021-04-21 ENCOUNTER — Ambulatory Visit (INDEPENDENT_AMBULATORY_CARE_PROVIDER_SITE_OTHER): Payer: BC Managed Care – PPO | Admitting: Family Medicine

## 2021-04-21 ENCOUNTER — Ambulatory Visit (INDEPENDENT_AMBULATORY_CARE_PROVIDER_SITE_OTHER): Payer: BC Managed Care – PPO

## 2021-04-21 ENCOUNTER — Other Ambulatory Visit: Payer: Self-pay

## 2021-04-21 ENCOUNTER — Encounter: Payer: Self-pay | Admitting: Family Medicine

## 2021-04-21 VITALS — BP 110/62 | HR 100 | Temp 98.2°F | Ht 62.0 in | Wt 120.2 lb

## 2021-04-21 DIAGNOSIS — Z13220 Encounter for screening for lipoid disorders: Secondary | ICD-10-CM | POA: Diagnosis not present

## 2021-04-21 DIAGNOSIS — E611 Iron deficiency: Secondary | ICD-10-CM | POA: Diagnosis not present

## 2021-04-21 DIAGNOSIS — M542 Cervicalgia: Secondary | ICD-10-CM

## 2021-04-21 DIAGNOSIS — Z8249 Family history of ischemic heart disease and other diseases of the circulatory system: Secondary | ICD-10-CM

## 2021-04-21 DIAGNOSIS — R5383 Other fatigue: Secondary | ICD-10-CM | POA: Diagnosis not present

## 2021-04-21 DIAGNOSIS — F419 Anxiety disorder, unspecified: Secondary | ICD-10-CM

## 2021-04-21 DIAGNOSIS — F4321 Adjustment disorder with depressed mood: Secondary | ICD-10-CM

## 2021-04-21 DIAGNOSIS — M549 Dorsalgia, unspecified: Secondary | ICD-10-CM | POA: Diagnosis not present

## 2021-04-21 LAB — CBC WITH DIFFERENTIAL/PLATELET
Basophils Absolute: 0 10*3/uL (ref 0.0–0.1)
Basophils Relative: 0.5 % (ref 0.0–3.0)
Eosinophils Absolute: 0.1 10*3/uL (ref 0.0–0.7)
Eosinophils Relative: 1.3 % (ref 0.0–5.0)
HCT: 32.1 % — ABNORMAL LOW (ref 36.0–46.0)
Hemoglobin: 10.3 g/dL — ABNORMAL LOW (ref 12.0–15.0)
Lymphocytes Relative: 24.8 % (ref 12.0–46.0)
Lymphs Abs: 1.2 10*3/uL (ref 0.7–4.0)
MCHC: 32.2 g/dL (ref 30.0–36.0)
MCV: 84.6 fl (ref 78.0–100.0)
Monocytes Absolute: 0.4 10*3/uL (ref 0.1–1.0)
Monocytes Relative: 8.3 % (ref 3.0–12.0)
Neutro Abs: 3.2 10*3/uL (ref 1.4–7.7)
Neutrophils Relative %: 65.1 % (ref 43.0–77.0)
Platelets: 277 10*3/uL (ref 150.0–400.0)
RBC: 3.8 Mil/uL — ABNORMAL LOW (ref 3.87–5.11)
RDW: 14.8 % (ref 11.5–15.5)
WBC: 4.9 10*3/uL (ref 4.0–10.5)

## 2021-04-21 LAB — COMPREHENSIVE METABOLIC PANEL
ALT: 9 U/L (ref 0–35)
AST: 14 U/L (ref 0–37)
Albumin: 4.3 g/dL (ref 3.5–5.2)
Alkaline Phosphatase: 39 U/L (ref 39–117)
BUN: 10 mg/dL (ref 6–23)
CO2: 27 mEq/L (ref 19–32)
Calcium: 9.3 mg/dL (ref 8.4–10.5)
Chloride: 104 mEq/L (ref 96–112)
Creatinine, Ser: 0.8 mg/dL (ref 0.40–1.20)
GFR: 95.26 mL/min (ref >60.00)
Glucose, Bld: 74 mg/dL (ref 70–99)
Potassium: 4.2 mEq/L (ref 3.5–5.1)
Sodium: 137 mEq/L (ref 135–145)
Total Bilirubin: 1 mg/dL (ref 0.2–1.2)
Total Protein: 7.7 g/dL (ref 6.0–8.3)

## 2021-04-21 LAB — IBC + FERRITIN
Ferritin: 1.8 ng/mL — ABNORMAL LOW (ref 10.0–291.0)
Iron: 29 ug/dL — ABNORMAL LOW (ref 42–145)
Saturation Ratios: 5.7 % — ABNORMAL LOW (ref 20.0–50.0)
TIBC: 508.2 ug/dL — ABNORMAL HIGH (ref 250.0–450.0)
Transferrin: 363 mg/dL — ABNORMAL HIGH (ref 212.0–360.0)

## 2021-04-21 LAB — TSH: TSH: 0.74 u[IU]/mL (ref 0.35–5.50)

## 2021-04-21 LAB — VITAMIN D 25 HYDROXY (VIT D DEFICIENCY, FRACTURES): VITD: 21.14 ng/mL — ABNORMAL LOW (ref 30.00–100.00)

## 2021-04-21 LAB — VITAMIN B12: Vitamin B-12: 215 pg/mL (ref 211–911)

## 2021-04-21 MED ORDER — HYDROXYZINE HCL 25 MG PO TABS: 25.0000 mg | ORAL_TABLET | Freq: Three times a day (TID) | ORAL | 0 refills | Status: DC | PRN

## 2021-04-21 NOTE — Progress Notes (Signed)
Nancy Mcmahon DOB: March 06, 1986 Encounter date: 04/21/2021  This is a 36 y.o. female who presents with Chief Complaint  Patient presents with   Neck Pain    Patient complains of posterior neck pain x1 month, no known injury   Fatigue    X1 month, requests repeat labs    History of present illness: Energy level  has been much worse in last month. Last week she started looking at ice again, so worried something was wrong. Did get iron infusions previously and this helped her in past. She is taking multivitamin daily; thinks there is iron in this.   Periods are lasting about 5 days. She is using pad and tampon and changes about 4 times/day. No clots.   Periods every 4 weeks.   Neck started hurting.feels if moving or if someone hugs her from back. Laying down bothers her. Sometimes in shoulders. Just noted when she was turning a certain way. No known neck injury. Notes more with turning certain direction. Sudden. Pain is sharp. Does have some numbness in fingertips left hand. Thoracic back pain.     Her mother passed away very unexpectedly in the fall.  The whole situation surrounding her death was poorly handled by family and authorities.  This added a lot of stress.  Patient was very close to her mother and doctor on a daily basis.  Shortly before her mother's passing, her significant other filed for separation.  The combination the stressors including her having to seek employment have been a lot to handle.  Mood has been more depressed, anxious.  She feels that she lives in a state of anxiety.  Sleep is poor.  Difficulty with falling and staying asleep.  She would not hurt herself as she feels she needs to be there for her children, but she is having a harder time with wanting to make it through the struggle of her day-to-day activities.  She has never been on any medication for mood in the past.  She was using Benadryl to help with sleep, but it seems to not be helping so much  anymore.   Allergies  Allergen Reactions   Fish Allergy    Peanut-Containing Drug Products    Shellfish Allergy    Strawberry Flavor    Wheat Bran    Penicillins Rash   Current Meds  Medication Sig   Multiple Vitamin (MULTIVITAMIN ADULT PO) Take by mouth.   [DISCONTINUED] EPINEPHrine (AUVI-Q) 0.3 mg/0.3 mL IJ SOAJ injection Inject 0.3 mg into the muscle as needed for anaphylaxis.    Review of Systems  Constitutional:  Negative for chills, fatigue and fever.  Respiratory:  Negative for cough, chest tightness, shortness of breath and wheezing.   Cardiovascular:  Negative for chest pain, palpitations and leg swelling.  Musculoskeletal:  Positive for back pain and neck pain.  Psychiatric/Behavioral:  Positive for sleep disturbance. Suicidal ideas: has had thoughts of not living; but no thoughts of self harm.The patient is nervous/anxious.    Objective:  BP 110/62 (BP Location: Right Arm, Patient Position: Sitting, Cuff Size: Normal)    Pulse 100    Temp 98.2 F (36.8 C) (Oral)    Ht 5\' 2"  (1.575 m)    Wt 120 lb 3.2 oz (54.5 kg)    BMI 21.98 kg/m   Weight: 120 lb 3.2 oz (54.5 kg)   BP Readings from Last 3 Encounters:  04/21/21 110/62  06/26/20 122/72  05/26/20 118/78   Wt Readings from Last 3 Encounters:  04/21/21 120 lb 3.2 oz (54.5 kg)  06/26/20 137 lb (62.1 kg)  05/26/20 137 lb (62.1 kg)    Physical Exam Constitutional:      General: She is not in acute distress.    Appearance: She is well-developed.  Cardiovascular:     Rate and Rhythm: Normal rate and regular rhythm.     Heart sounds: Normal heart sounds. No murmur heard.   No friction rub.  Pulmonary:     Effort: Pulmonary effort is normal. No respiratory distress.     Breath sounds: Normal breath sounds. No wheezing or rales.  Musculoskeletal:     Right lower leg: No edema.     Left lower leg: No edema.     Comments: She has some slight posterior discomfort with side to side head turn and upper thoracic/lower  cervical spine.  There is some mild tenderness to palpation of thoracic spine.  She has some bilateral trapezius tenderness to palpation.  She has some pain in the left upper shoulder with Spurling's testing, popping posterior neck with Spurling's left and right.  Full range of motion of arms, some discomfort in trapezius on left with posterior left arm movement.  No weakness noted in upper extremities.  Sensations intact in bilateral upper extremities.  Neurological:     Mental Status: She is alert and oriented to person, place, and time.  Psychiatric:        Behavior: Behavior normal.    Assessment/Plan  1. Iron deficiency She has a history of severe iron deficiency anemia.  She has had iron infusions in the past.  Recheck blood work to see where she is currently out of her baseline.  Consider referral back to hematology if needed. - IBC + Ferritin; Future - IBC + Ferritin  2. Neck pain Starting with x-rays today.  No known injury, but ongoing and worsening neck pain.  Further recommendations pending x-ray results. - DG Cervical Spine Complete; Future - DG Thoracic Spine W/Swimmers; Future  3. Other fatigue - CBC with Differential/Platelet; Future - Comprehensive metabolic panel; Future - TSH; Future - Vitamin B12; Future - VITAMIN D 25 Hydroxy (Vit-D Deficiency, Fractures); Future - VITAMIN D 25 Hydroxy (Vit-D Deficiency, Fractures) - Vitamin B12 - TSH - Comprehensive metabolic panel - CBC with Differential/Platelet  4. Family history of cardiac arrest - NMR, lipoprofile; Future - NMR, lipoprofile  5. Lipid screening - Comprehensive metabolic panel; Future - NMR, lipoprofile; Future - NMR, lipoprofile - Comprehensive metabolic panel  6. Anxiety We discussed options to help with grieving process as well as anxiety and depressed mood.  We are going to start with hydroxyzine to help hopefully with anxiety and with sleep.  She also is interested in talking with a therapist,  although this is something she has not done before.  Numbers were given for behavioral health today.  Encouraged her to reach out if this medication is not helping her.  We also discussed SSRIs to help with overall anxiety and depression control.  I am hopeful that with some therapy she can get some assistance to help her through the grieving process. - hydrOXYzine (ATARAX) 25 MG tablet; Take 1 tablet (25 mg total) by mouth 3 (three) times daily as needed.  Dispense: 90 tablet; Refill: 0  7. Grief See above.   Return for pending lab results.     Micheline Rough, MD

## 2021-04-22 LAB — NMR, LIPOPROFILE
Cholesterol, Total: 151 mg/dL (ref 100–199)
HDL Particle Number: 32.5 umol/L (ref >=30.5)
HDL-C: 69 mg/dL (ref >39)
LDL Particle Number: 584 nmol/L (ref <1000)
LDL Size: 21 nm (ref >20.5)
LDL-C (NIH Calc): 69 mg/dL (ref 0–99)
LP-IR Score: <25 (ref <=45)
Small LDL Particle Number: 147 nmol/L (ref <=527)
Triglycerides: 62 mg/dL (ref 0–149)

## 2021-04-23 ENCOUNTER — Other Ambulatory Visit: Payer: Self-pay | Admitting: Hematology and Oncology

## 2021-04-27 ENCOUNTER — Telehealth: Payer: Self-pay | Admitting: Family Medicine

## 2021-04-27 ENCOUNTER — Telehealth: Payer: Self-pay | Admitting: Hematology and Oncology

## 2021-04-27 NOTE — Telephone Encounter (Signed)
Scheduled per 1/20 sch msg, attempted to call patient x2, phone rings twice then goes to voicemail which is full, so I am unable to leave a message. Will mail calender

## 2021-04-27 NOTE — Telephone Encounter (Signed)
We gave her the [facility] behavioral health pamphlet.she has to call herself as we cannot set up therapy appointment. She could also call back of insurance card for "behavioral health" and get list of covered therapists.

## 2021-04-27 NOTE — Telephone Encounter (Signed)
Pt call and stated she and dr.Koberlein talked about going to a therapist and she want dr.Koberlien to referral her to a therapist.

## 2021-04-28 ENCOUNTER — Telehealth: Payer: Self-pay | Admitting: Hematology and Oncology

## 2021-04-28 NOTE — Telephone Encounter (Signed)
Unable to leave a message due to voicemail being full. 

## 2021-04-28 NOTE — Telephone Encounter (Signed)
Attempted to r/s per 1/24 inbasket, unable to leave message.

## 2021-04-28 NOTE — Telephone Encounter (Signed)
Rescheduled appts per 1/24 pt request, pt stated they did not want to start infusion until 2/28 and can only have appts on tuesdays. Pt has confirmed new appts made starting 2/28

## 2021-04-29 NOTE — Telephone Encounter (Signed)
Unable to leave a message due to voicemail being full. 

## 2021-04-30 ENCOUNTER — Ambulatory Visit: Payer: BC Managed Care – PPO

## 2021-04-30 ENCOUNTER — Other Ambulatory Visit: Payer: BC Managed Care – PPO

## 2021-04-30 ENCOUNTER — Ambulatory Visit: Payer: BC Managed Care – PPO | Admitting: Hematology and Oncology

## 2021-04-30 NOTE — Telephone Encounter (Signed)
Unable to leave a message due to voicemail being full. 

## 2021-05-01 NOTE — Telephone Encounter (Signed)
Spoke with the patient and informed her of the message below.  Patient stated the message is incorrect as she was referring to a therapist for neck pain and has already scheduled an appt with behavioral health.  Referral was placed to Dr Tamala Julian for neck pain-see results note.

## 2021-05-05 DIAGNOSIS — F321 Major depressive disorder, single episode, moderate: Secondary | ICD-10-CM | POA: Diagnosis not present

## 2021-05-05 DIAGNOSIS — F411 Generalized anxiety disorder: Secondary | ICD-10-CM | POA: Diagnosis not present

## 2021-05-07 ENCOUNTER — Ambulatory Visit: Payer: BC Managed Care – PPO

## 2021-05-14 ENCOUNTER — Ambulatory Visit: Payer: BC Managed Care – PPO

## 2021-05-21 ENCOUNTER — Ambulatory Visit: Payer: BC Managed Care – PPO

## 2021-05-28 ENCOUNTER — Ambulatory Visit: Payer: BC Managed Care – PPO

## 2021-06-01 ENCOUNTER — Other Ambulatory Visit: Payer: Self-pay | Admitting: Hematology and Oncology

## 2021-06-01 DIAGNOSIS — D509 Iron deficiency anemia, unspecified: Secondary | ICD-10-CM

## 2021-06-01 NOTE — Progress Notes (Signed)
Visit rescheduled 

## 2021-06-02 ENCOUNTER — Other Ambulatory Visit: Payer: BC Managed Care – PPO

## 2021-06-02 ENCOUNTER — Ambulatory Visit: Payer: BC Managed Care – PPO

## 2021-06-02 ENCOUNTER — Ambulatory Visit: Payer: BC Managed Care – PPO | Admitting: Hematology and Oncology

## 2021-06-02 DIAGNOSIS — D509 Iron deficiency anemia, unspecified: Secondary | ICD-10-CM

## 2021-06-03 ENCOUNTER — Encounter: Payer: Self-pay | Admitting: Hematology and Oncology

## 2021-06-09 ENCOUNTER — Inpatient Hospital Stay: Payer: BC Managed Care – PPO | Attending: Hematology and Oncology

## 2021-06-09 ENCOUNTER — Other Ambulatory Visit: Payer: Self-pay

## 2021-06-09 ENCOUNTER — Encounter: Payer: Self-pay | Admitting: Hematology and Oncology

## 2021-06-09 VITALS — BP 107/72 | HR 86 | Temp 98.3°F | Resp 18 | Wt 118.0 lb

## 2021-06-09 DIAGNOSIS — D500 Iron deficiency anemia secondary to blood loss (chronic): Secondary | ICD-10-CM | POA: Insufficient documentation

## 2021-06-09 DIAGNOSIS — D509 Iron deficiency anemia, unspecified: Secondary | ICD-10-CM

## 2021-06-09 MED ORDER — SODIUM CHLORIDE 0.9 % IV SOLN: Freq: Once | INTRAVENOUS | Status: AC

## 2021-06-09 MED ORDER — SODIUM CHLORIDE 0.9 % IV SOLN
200.0000 mg | Freq: Once | INTRAVENOUS | Status: AC
  Administered 2021-06-09: 200 mg via INTRAVENOUS
  Filled 2021-06-09: qty 200

## 2021-06-09 NOTE — Patient Instructions (Signed)

## 2021-06-16 ENCOUNTER — Inpatient Hospital Stay: Payer: BC Managed Care – PPO

## 2021-06-16 ENCOUNTER — Other Ambulatory Visit: Payer: Self-pay

## 2021-06-16 VITALS — BP 105/67 | HR 83 | Temp 98.6°F | Resp 18

## 2021-06-16 DIAGNOSIS — D509 Iron deficiency anemia, unspecified: Secondary | ICD-10-CM

## 2021-06-16 DIAGNOSIS — D500 Iron deficiency anemia secondary to blood loss (chronic): Secondary | ICD-10-CM | POA: Diagnosis not present

## 2021-06-16 MED ORDER — SODIUM CHLORIDE 0.9 % IV SOLN: Freq: Once | INTRAVENOUS | Status: AC

## 2021-06-16 MED ORDER — SODIUM CHLORIDE 0.9 % IV SOLN
200.0000 mg | Freq: Once | INTRAVENOUS | Status: AC
  Administered 2021-06-16: 200 mg via INTRAVENOUS
  Filled 2021-06-16: qty 200

## 2021-06-16 NOTE — Patient Instructions (Signed)

## 2021-06-16 NOTE — Progress Notes (Signed)
Patient tolerated IV treatment well. Declined to stay for 30 minute observation period. Monitored for 15 minutes without incident. VSS, ambulatory to lobby.  ?

## 2021-06-23 ENCOUNTER — Inpatient Hospital Stay: Payer: BC Managed Care – PPO

## 2021-06-23 ENCOUNTER — Other Ambulatory Visit: Payer: Self-pay

## 2021-06-23 VITALS — BP 119/68 | HR 89 | Temp 98.7°F | Resp 16

## 2021-06-23 DIAGNOSIS — D500 Iron deficiency anemia secondary to blood loss (chronic): Secondary | ICD-10-CM | POA: Diagnosis not present

## 2021-06-23 DIAGNOSIS — D509 Iron deficiency anemia, unspecified: Secondary | ICD-10-CM

## 2021-06-23 MED ORDER — SODIUM CHLORIDE 0.9 % IV SOLN
200.0000 mg | Freq: Once | INTRAVENOUS | Status: AC
  Administered 2021-06-23: 200 mg via INTRAVENOUS
  Filled 2021-06-23: qty 200

## 2021-06-23 MED ORDER — SODIUM CHLORIDE 0.9 % IV SOLN: Freq: Once | INTRAVENOUS | Status: AC

## 2021-06-23 NOTE — Patient Instructions (Signed)

## 2021-06-23 NOTE — Progress Notes (Signed)
Patient tolerated IV iron infusion well. Declined to stay for 30 minute post observation period. VSS, ambulatory to lobby.  

## 2021-06-30 ENCOUNTER — Ambulatory Visit: Payer: BC Managed Care – PPO

## 2021-07-03 ENCOUNTER — Other Ambulatory Visit: Payer: Self-pay

## 2021-07-03 ENCOUNTER — Inpatient Hospital Stay: Payer: BC Managed Care – PPO

## 2021-07-03 VITALS — BP 110/62 | HR 94 | Temp 97.9°F | Resp 18

## 2021-07-03 DIAGNOSIS — D509 Iron deficiency anemia, unspecified: Secondary | ICD-10-CM

## 2021-07-03 DIAGNOSIS — D500 Iron deficiency anemia secondary to blood loss (chronic): Secondary | ICD-10-CM | POA: Diagnosis not present

## 2021-07-03 MED ORDER — SODIUM CHLORIDE 0.9 % IV SOLN: Freq: Once | INTRAVENOUS | Status: AC

## 2021-07-03 MED ORDER — SODIUM CHLORIDE 0.9 % IV SOLN
200.0000 mg | Freq: Once | INTRAVENOUS | Status: AC
  Administered 2021-07-03: 200 mg via INTRAVENOUS
  Filled 2021-07-03: qty 200

## 2021-07-03 NOTE — Patient Instructions (Signed)
[facility]  Discharge Instructions: ?Thank you for choosing [facility] to provide your oncology and hematology care.  ? ?If you have a lab appointment with the [facility], please go directly to the [facility] and check in at the registration area. ?  ?Wear comfortable clothing and clothing appropriate for easy access to any Portacath or PICC line.  ? ?We strive to give you quality time with your provider. You may need to reschedule your appointment if you arrive late (15 or more minutes).  Arriving late affects you and other patients whose appointments are after yours.  Also, if you miss three or more appointments without notifying the office, you may be dismissed from the clinic at the provider?s discretion.    ?  ?For prescription refill requests, have your pharmacy contact our office and allow 72 hours for refills to be completed.   ? ?Today you received the following chemotherapy and/or immunotherapy agents: Venofer.    ?  ?To help prevent nausea and vomiting after your treatment, we encourage you to take your nausea medication as directed. ? ?BELOW ARE SYMPTOMS THAT SHOULD BE REPORTED IMMEDIATELY: ?*FEVER GREATER THAN 100.4 F (38 ?C) OR HIGHER ?*CHILLS OR SWEATING ?*NAUSEA AND VOMITING THAT IS NOT CONTROLLED WITH YOUR NAUSEA MEDICATION ?*UNUSUAL SHORTNESS OF BREATH ?*UNUSUAL BRUISING OR BLEEDING ?*URINARY PROBLEMS (pain or burning when urinating, or frequent urination) ?*BOWEL PROBLEMS (unusual diarrhea, constipation, pain near the anus) ?TENDERNESS IN MOUTH AND THROAT WITH OR WITHOUT PRESENCE OF ULCERS (sore throat, sores in mouth, or a toothache) ?UNUSUAL RASH, SWELLING OR PAIN  ?UNUSUAL VAGINAL DISCHARGE OR ITCHING  ? ?Items with * indicate a potential emergency and should be followed up as soon as possible or go to the Emergency Department if any problems should occur. ? ?Please show the CHEMOTHERAPY ALERT CARD or IMMUNOTHERAPY ALERT CARD at check-in to  the Emergency Department and triage nurse. ? ?Should you have questions after your visit or need to cancel or reschedule your appointment, please contact [facility]  Dept: [phone]  and follow the prompts.  Office hours are 8:00 a.m. to 4:30 p.m. Monday - Friday. Please note that voicemails left after 4:00 p.m. may not be returned until the following business day.  We are closed weekends and major holidays. You have access to a nurse at all times for urgent questions. Please call the main number to the clinic Dept: [phone] and follow the prompts. ? ? ?For any non-urgent questions, you may also contact your provider using MyChart. We now offer e-Visits for anyone 6 and older to request care online for non-urgent symptoms. For details visit [email or web address]. ?  ?Also download the MyChart app! Go to the app store, search "MyChart", open the app, select [facility], and log in with your MyChart username and password. ? ?Due to Covid, a mask is required upon entering the hospital/clinic. If you do not have a mask, one will be given to you upon arrival. For doctor visits, patients may have 1 support person aged 13 or older with them. For treatment visits, patients cannot have anyone with them due to current Covid guidelines and our immunocompromised population.  ? ?

## 2021-07-03 NOTE — Progress Notes (Signed)
Pt declined 30 minute observation period post Venofer infusion. VSS. No complaints at time of discharge.  

## 2021-09-22 ENCOUNTER — Telehealth: Payer: Self-pay | Admitting: Family Medicine

## 2021-09-22 ENCOUNTER — Other Ambulatory Visit: Payer: Self-pay | Admitting: Family

## 2021-09-22 DIAGNOSIS — F419 Anxiety disorder, unspecified: Secondary | ICD-10-CM

## 2021-09-22 MED ORDER — HYDROXYZINE HCL 25 MG PO TABS: 25.0000 mg | ORAL_TABLET | Freq: Three times a day (TID) | ORAL | 0 refills | Status: DC | PRN

## 2021-09-22 NOTE — Telephone Encounter (Signed)
Requesting refill hydrOXYzine (ATARAX) 25 MG tablet

## 2021-10-14 ENCOUNTER — Other Ambulatory Visit: Payer: Self-pay | Admitting: Family

## 2021-10-14 DIAGNOSIS — F419 Anxiety disorder, unspecified: Secondary | ICD-10-CM

## 2021-12-29 ENCOUNTER — Ambulatory Visit (INDEPENDENT_AMBULATORY_CARE_PROVIDER_SITE_OTHER): Payer: BC Managed Care – PPO | Admitting: Family Medicine

## 2021-12-29 ENCOUNTER — Telehealth: Payer: Self-pay | Admitting: Family Medicine

## 2021-12-29 ENCOUNTER — Encounter: Payer: Self-pay | Admitting: Family Medicine

## 2021-12-29 VITALS — BP 122/80 | HR 100 | Temp 99.0°F | Ht 62.0 in | Wt 126.0 lb

## 2021-12-29 DIAGNOSIS — E559 Vitamin D deficiency, unspecified: Secondary | ICD-10-CM

## 2021-12-29 DIAGNOSIS — F322 Major depressive disorder, single episode, severe without psychotic features: Secondary | ICD-10-CM

## 2021-12-29 DIAGNOSIS — F419 Anxiety disorder, unspecified: Secondary | ICD-10-CM | POA: Diagnosis not present

## 2021-12-29 DIAGNOSIS — F329 Major depressive disorder, single episode, unspecified: Secondary | ICD-10-CM | POA: Insufficient documentation

## 2021-12-29 DIAGNOSIS — D509 Iron deficiency anemia, unspecified: Secondary | ICD-10-CM

## 2021-12-29 MED ORDER — HYDROXYZINE HCL 25 MG PO TABS: 25.0000 mg | ORAL_TABLET | Freq: Three times a day (TID) | ORAL | 5 refills | Status: AC | PRN

## 2021-12-29 MED ORDER — ESCITALOPRAM OXALATE 10 MG PO TABS: 10.0000 mg | ORAL_TABLET | Freq: Every day | ORAL | 1 refills | Status: DC

## 2021-12-29 NOTE — Telephone Encounter (Signed)
Patient dropped off paperwork needing to be completed by pcp. Patient has specified what days she is needing for the FMLA on a sticky note that is placed on paperwork. Patient stated she will complete her portion of the paperwork after she receives the paperwork back.      Paperwork placed in folder for completion.      Please advise

## 2021-12-29 NOTE — Progress Notes (Signed)
Established Patient Office Visit  Subjective   Patient ID: Nancy Mcmahon, female    DOB: 08-16-1985  Age: 36 y.o. MRN: [id number]  Chief Complaint  Patient presents with  . Establish Care  . Form Completion    Patient requests FMLA paperwork be completed for mental health, states her psychiatrist diagnosed her with anxiety  . Shortness of Breath    X1 month    Patient is here for transition of care visit. 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 She states that she asks for time off however she is often not allowed to have the time off she is requesting. she states that for the last few months her anxiety has gotten much worse. States that her mother passed away and it is the 1 year anniversary of her death. States that she is finalizing her divorce and one of her sons is leaving to move in with her ex-husband.   Shortness of Breath   {History (Optional):23778}  Review of Systems  Respiratory:  Positive for shortness of breath.       Objective:     BP 122/80 (BP Location: Right Arm, Patient Position: Sitting, Cuff Size: Normal)   Pulse 100   Temp 99 F (37.2 C) (Oral)   Ht '5\' 2"'$  (1.575 m)   Wt 126 lb (57.2 kg)   LMP 12/16/2021 (Exact Date)   SpO2 94%   BMI 23.05 kg/m  {Vitals History (Optional):23777}  Physical Exam Vitals reviewed.  Constitutional:      Appearance: Normal appearance. She is well-groomed and normal weight.  HENT:     Head: Normocephalic and atraumatic.     Mouth/Throat:     Mouth: Mucous membranes are moist.     Pharynx: Oropharynx is clear.  Eyes:     Extraocular Movements: Extraocular movements intact.     Conjunctiva/sclera: Conjunctivae normal.     Pupils: Pupils are equal, round, and reactive to  light.  Cardiovascular:     Rate and Rhythm: Normal rate and regular rhythm.     Heart sounds: S1 normal and S2 normal.  Pulmonary:     Effort: Pulmonary effort is normal.     Breath sounds: Normal breath sounds and air entry.  Abdominal:     General: Abdomen is flat. Bowel sounds are normal.     Palpations: Abdomen is soft.  Musculoskeletal:        General: Normal range of motion.     Right lower leg: No edema.     Left lower leg: No edema.  Skin:    General: Skin is warm and dry.  Neurological:     Mental Status: She is alert and oriented to person, place, and time. Mental status is at baseline.     Gait: Gait is intact.  Psychiatric:        Mood and Affect: Mood is anxious. Affect is tearful.        Speech: Speech normal.        Behavior: Behavior is agitated.        Thought Content: Thought content normal.        Cognition and Memory: Cognition and memory normal.        Judgment: Judgment normal.  No results found for any visits on 12/29/21.  {Labs (Optional):23779}  The ASCVD Risk score (Arnett DK, et al., 2019) failed to calculate for the following reasons:   The 2019 ASCVD risk score is only valid for ages 20 to 29    Assessment & Plan:   Problem List Items Addressed This Visit       Other   Major depressive disorder with single episode   Relevant Medications   hydrOXYzine (ATARAX) 25 MG tablet   escitalopram (LEXAPRO) 10 MG tablet   Other Visit Diagnoses     Anxiety       Relevant Medications   hydrOXYzine (ATARAX) 25 MG tablet   escitalopram (LEXAPRO) 10 MG tablet       Return in about 5 weeks (around 02/02/2022).    Farrel Conners, MD

## 2021-12-30 NOTE — Telephone Encounter (Signed)
Paperwork done and on your desk!

## 2021-12-30 NOTE — Telephone Encounter (Signed)
Spoke with the patient and informed her the form was completed, will be left at the front desk for pick up and there is a $29 fee.  Copy sent to be scanned.

## 2021-12-31 DIAGNOSIS — E559 Vitamin D deficiency, unspecified: Secondary | ICD-10-CM | POA: Insufficient documentation

## 2021-12-31 NOTE — Assessment & Plan Note (Signed)
Last iron panel was reviewed, she is not currently on any iron supplements at this time, will recheck after we improve her depression/anxiety levels.

## 2021-12-31 NOTE — Assessment & Plan Note (Signed)
Vitamin D is also low on her last set of labs, will recheck this after we improve her depression symptoms.

## 2021-12-31 NOTE — Assessment & Plan Note (Signed)
Severe episode with significant anxiety due to multiple external stressors. Patient needs FMLA filled out, I advised that she start lexapro 10 mg daily and I will schedule her for a short term follow up in 5 weeks. I advised her to message me through My Chart if she has any thoughts of SI before our video visit. She will also continue the hydroxyzine as needed for acute anxiety and sleep.

## 2022-01-06 DIAGNOSIS — J3489 Other specified disorders of nose and nasal sinuses: Secondary | ICD-10-CM | POA: Diagnosis not present

## 2022-01-06 DIAGNOSIS — H9201 Otalgia, right ear: Secondary | ICD-10-CM | POA: Diagnosis not present

## 2022-01-06 DIAGNOSIS — R0981 Nasal congestion: Secondary | ICD-10-CM | POA: Diagnosis not present

## 2022-01-06 DIAGNOSIS — J029 Acute pharyngitis, unspecified: Secondary | ICD-10-CM | POA: Diagnosis not present

## 2022-01-19 NOTE — Telephone Encounter (Signed)
Pt came in to say the FMLA papers were completed incorrectly.  Pt put sticky notes on a blank page of the FMLA form, to show what should have been written on the page. Those papers were put in MD's folder for correction. Pt is asking that MD please make the necessary corrections as soon as possible, because she needs them for work.  Pt is asking for a call back as soon as the papers are ready to be picked up.

## 2022-01-20 NOTE — Telephone Encounter (Signed)
Spoke with the patient and informed her the form was completed with corrections by PCP and left at the front desk for pick up.  Copy sent to be scanned.

## 2022-01-27 DIAGNOSIS — H9209 Otalgia, unspecified ear: Secondary | ICD-10-CM | POA: Diagnosis not present

## 2022-01-27 DIAGNOSIS — J029 Acute pharyngitis, unspecified: Secondary | ICD-10-CM | POA: Diagnosis not present

## 2022-02-02 ENCOUNTER — Telehealth (INDEPENDENT_AMBULATORY_CARE_PROVIDER_SITE_OTHER): Payer: BC Managed Care – PPO | Admitting: Family Medicine

## 2022-02-02 ENCOUNTER — Telehealth: Payer: BC Managed Care – PPO | Admitting: Family Medicine

## 2022-02-02 ENCOUNTER — Encounter: Payer: Self-pay | Admitting: Family Medicine

## 2022-02-02 DIAGNOSIS — F322 Major depressive disorder, single episode, severe without psychotic features: Secondary | ICD-10-CM | POA: Diagnosis not present

## 2022-02-02 MED ORDER — SERTRALINE HCL 50 MG PO TABS: 50.0000 mg | ORAL_TABLET | Freq: Every day | ORAL | 1 refills | Status: DC

## 2022-02-02 NOTE — Assessment & Plan Note (Signed)
Had side effect of blurry vision with the lexapro, will discontinue this and switch her to sertraline 50 mg daily. I will follow up with her in 7 weeks via video visit to reassess. Pt instructed that if she has side effects with this medication to call me before the next scheduled visit to let me know.

## 2022-02-02 NOTE — Progress Notes (Signed)
   Established Patient Office Visit  Subjective   Patient ID: Nancy Mcmahon, female    DOB: Sep 23, 1985  Age: 36 y.o. MRN: [id number]  Chief Complaint  Patient presents with   Follow-up   I connected with  Shelbie Proctor on 02/02/22 by a video enabled telemedicine application and verified that I am speaking with the correct person using two identifiers.   I discussed the limitations of evaluation and management by telemedicine. The patient expressed understanding and agreed to proceed.   Patient location: in her vehicle, [place], [place] Provider location: [employer] office  Patient is here for follow up. States that the lexapro caused blurry/double vision. States that it was happening when she was driving. States she took the medication for a few days but the side effect did not go away. She continued to take the hydroxyzine as needed for her anxiety and sleep which did help. Overall her symptoms have not improved much since the last visit. We discussed trying a different SSRI and she is agreeable. She is doing better at work after the [employer] were completed.   Current Outpatient Medications  Medication Instructions   hydrOXYzine (ATARAX) 25 mg, Oral, 3 times daily PRN   Multiple Vitamin (MULTIVITAMIN ADULT PO) Oral   sertraline (ZOLOFT) 50 mg, Oral, Daily      Review of Systems  All other systems reviewed and are negative.     Objective:     LMP 01/30/2022 (Exact Date)    Physical Exam Constitutional:      Appearance: Normal appearance. She is normal weight.  Neurological:     Mental Status: She is alert and oriented to person, place, and time. Mental status is at baseline.  Psychiatric:        Mood and Affect: Mood normal.        Behavior: Behavior normal.        Thought Content: Thought content normal.        Judgment: Judgment normal.      No results found for any visits on 02/02/22.    The ASCVD Risk score (Arnett DK, et al., 2019) failed to  calculate for the following reasons:   The 2019 ASCVD risk score is only valid for ages 41 to 16    Assessment & Plan:   Problem List Items Addressed This Visit       Other   Major depressive disorder with single episode - Primary    Had side effect of blurry vision with the lexapro, will discontinue this and switch her to sertraline 50 mg daily. I will follow up with her in 7 weeks via video visit to reassess. Pt instructed that if she has side effects with this medication to call me before the next scheduled visit to let me know.       Relevant Medications   sertraline (ZOLOFT) 50 MG tablet    Return in about 7 weeks (around 03/23/2022) for video visit for follow up on depression.Farrel Conners, MD

## 2022-05-17 ENCOUNTER — Encounter: Payer: Self-pay | Admitting: Hematology and Oncology

## 2022-05-18 ENCOUNTER — Encounter: Payer: Self-pay | Admitting: Family Medicine

## 2022-05-18 ENCOUNTER — Ambulatory Visit (INDEPENDENT_AMBULATORY_CARE_PROVIDER_SITE_OTHER): Payer: No Typology Code available for payment source | Admitting: Family Medicine

## 2022-05-18 VITALS — BP 100/62 | HR 82 | Temp 98.7°F | Ht 62.0 in | Wt 136.3 lb

## 2022-05-18 DIAGNOSIS — M79605 Pain in left leg: Secondary | ICD-10-CM

## 2022-05-18 DIAGNOSIS — I8312 Varicose veins of left lower extremity with inflammation: Secondary | ICD-10-CM | POA: Diagnosis not present

## 2022-05-18 DIAGNOSIS — I8311 Varicose veins of right lower extremity with inflammation: Secondary | ICD-10-CM

## 2022-05-18 DIAGNOSIS — D509 Iron deficiency anemia, unspecified: Secondary | ICD-10-CM

## 2022-05-18 DIAGNOSIS — E538 Deficiency of other specified B group vitamins: Secondary | ICD-10-CM

## 2022-05-18 DIAGNOSIS — M79604 Pain in right leg: Secondary | ICD-10-CM | POA: Diagnosis not present

## 2022-05-18 DIAGNOSIS — E559 Vitamin D deficiency, unspecified: Secondary | ICD-10-CM | POA: Diagnosis not present

## 2022-05-18 DIAGNOSIS — I8003 Phlebitis and thrombophlebitis of superficial vessels of lower extremities, bilateral: Secondary | ICD-10-CM

## 2022-05-18 LAB — CBC WITH DIFFERENTIAL/PLATELET
Basophils Absolute: 0 10*3/uL (ref 0.0–0.1)
Basophils Relative: 0.5 % (ref 0.0–3.0)
Eosinophils Absolute: 0.1 10*3/uL (ref 0.0–0.7)
Eosinophils Relative: 1.6 % (ref 0.0–5.0)
HCT: 31 % — ABNORMAL LOW (ref 36.0–46.0)
Hemoglobin: 10.2 g/dL — ABNORMAL LOW (ref 12.0–15.0)
Lymphocytes Relative: 26.5 % (ref 12.0–46.0)
Lymphs Abs: 1.5 10*3/uL (ref 0.7–4.0)
MCHC: 33 g/dL (ref 30.0–36.0)
MCV: 83.9 fl (ref 78.0–100.0)
Monocytes Absolute: 0.5 10*3/uL (ref 0.1–1.0)
Monocytes Relative: 8.3 % (ref 3.0–12.0)
Neutro Abs: 3.6 10*3/uL (ref 1.4–7.7)
Neutrophils Relative %: 63.1 % (ref 43.0–77.0)
Platelets: 270 10*3/uL (ref 150.0–400.0)
RBC: 3.7 Mil/uL — ABNORMAL LOW (ref 3.87–5.11)
RDW: 14.4 % (ref 11.5–15.5)
WBC: 5.7 10*3/uL (ref 4.0–10.5)

## 2022-05-18 LAB — VITAMIN B12: Vitamin B-12: 160 pg/mL — ABNORMAL LOW (ref 211–911)

## 2022-05-18 LAB — COMPREHENSIVE METABOLIC PANEL
ALT: 9 U/L (ref 0–35)
AST: 14 U/L (ref 0–37)
Albumin: 4.2 g/dL (ref 3.5–5.2)
Alkaline Phosphatase: 43 U/L (ref 39–117)
BUN: 10 mg/dL (ref 6–23)
CO2: 26 mEq/L (ref 19–32)
Calcium: 9.1 mg/dL (ref 8.4–10.5)
Chloride: 106 mEq/L (ref 96–112)
Creatinine, Ser: 0.84 mg/dL (ref 0.40–1.20)
GFR: 89.17 mL/min (ref >60.00)
Glucose, Bld: 85 mg/dL (ref 70–99)
Potassium: 4.1 mEq/L (ref 3.5–5.1)
Sodium: 137 mEq/L (ref 135–145)
Total Bilirubin: 0.8 mg/dL (ref 0.2–1.2)
Total Protein: 7.6 g/dL (ref 6.0–8.3)

## 2022-05-18 LAB — VITAMIN D 25 HYDROXY (VIT D DEFICIENCY, FRACTURES): VITD: 8.01 ng/mL — ABNORMAL LOW (ref 30.00–100.00)

## 2022-05-18 NOTE — Patient Instructions (Addendum)
Compression stockings- mild compression level  Ibuprofen 800 mg every 8 hours  Apply heating pad to the area where it hurts  Superficial thrombophlebitis-- is the possible  diagnosis

## 2022-05-18 NOTE — Progress Notes (Signed)
Acute Office Visit  Subjective:     Patient ID: Nancy Mcmahon, female    DOB: 11/22/1985, 37 y.o.   MRN: [id number]  Chief Complaint  Patient presents with   Leg Pain    Patient complains of left latera thigh pain for the past few weeks, no known injury, questioned if this could be related to the way she sits in her chair while working during the day and tried Ibuprofen with some relief    Leg Pain  The incident occurred more than 1 week ago. The incident occurred at work. There was no injury mechanism. The pain is present in the left thigh. The quality of the pain is described as aching. The pain is moderate. The pain has been Fluctuating since onset. She reports no foreign bodies present. She has tried NSAIDs for the symptoms. The treatment provided mild relief.   Patient is in today for leg pain on both sides. Left is worse, states that it hurts on the outside of both legs, but sometimes migrates. States that it might be from how she is sitting in her chair at work. Hurting mostly when she is sitting, also feels it when she is walking during the day. States hat it feels like a burning sensation, like a "crick" in her neck feels.    Review of Systems  All other systems reviewed and are negative.       Objective:    BP 100/62 (BP Location: Left Arm, Patient Position: Sitting, Cuff Size: Normal)   Pulse 82   Temp 98.7 F (37.1 C) (Oral)   Ht 5' 2"$  (1.575 m)   Wt 136 lb 4.8 oz (61.8 kg)   LMP 04/24/2022   SpO2 100%   BMI 24.93 kg/m    Physical Exam Vitals reviewed.  Constitutional:      Appearance: Normal appearance. She is well-groomed and normal weight.  Cardiovascular:     Rate and Rhythm: Normal rate and regular rhythm.     Pulses: Normal pulses.     Heart sounds: S1 normal and S2 normal.  Pulmonary:     Effort: Pulmonary effort is normal.     Breath sounds: Normal air entry.  Abdominal:     General: Bowel sounds are normal.  Musculoskeletal:     Left upper  leg: Tenderness (laterally and medially, there is a large ecchymosison the lateral thigh around some spider veins) present.     Right lower leg: No edema.     Left lower leg: No edema.  Neurological:     Mental Status: She is alert and oriented to person, place, and time. Mental status is at baseline.     Gait: Gait is intact.  Psychiatric:        Mood and Affect: Mood and affect normal.        Speech: Speech normal.        Behavior: Behavior normal.        Judgment: Judgment normal.     No results found for any visits on 05/18/22.      Assessment & Plan:   Problem List Items Addressed This Visit       Unprioritized   Iron deficiency anemia   Relevant Orders   CBC with Differential/Platelets   Iron, TIBC and Ferritin Panel   CMP   Vitamin D deficiency   Relevant Orders   Vitamin D, 25-hydroxy   Other Visit Diagnoses     Varicose veins of both lower extremities with inflammation    -  Primary   Relevant Orders   She has exquisite tenderness to palpation of her legs and a large bruise on the left lateral thigh which possibly could be a burst varicose vein. I will order D dimer and BL venous duplex. I advised she use compression stockings and to alternate positions frequently  VAS [place] LOWER EXTREMITY VENOUS (DVT)   D-dimer, Quantitative   Thrombophlebitis of superficial veins of both lower extremities       Relevant Orders   VAS [place] LOWER EXTREMITY VENOUS (DVT)   D-dimer, Quantitative   Leg pain, bilateral       Relevant Orders   Rechecking B12 level Vitamin B12       No orders of the defined types were placed in this encounter.   No follow-ups on file.  Farrel Conners, MD

## 2022-05-18 NOTE — Assessment & Plan Note (Signed)
Will check new levels today to follow up on this problem. Further replacement of vitamin D TBD after results are known.

## 2022-05-18 NOTE — Assessment & Plan Note (Signed)
History of iron deficiency anemia, will order new iron panel, CBC and CMP

## 2022-05-19 ENCOUNTER — Telehealth: Payer: Self-pay | Admitting: Family Medicine

## 2022-05-19 LAB — IRON,TIBC AND FERRITIN PANEL
%SAT: 5 % (calc) — ABNORMAL LOW (ref 16–45)
Ferritin: 1 ng/mL — ABNORMAL LOW (ref 16–154)
Iron: 26 ug/dL — ABNORMAL LOW (ref 40–190)
TIBC: 493 mcg/dL (calc) — ABNORMAL HIGH (ref 250–450)

## 2022-05-19 LAB — D-DIMER, QUANTITATIVE: D-Dimer, Quant: 0.75 mcg/mL FEU — ABNORMAL HIGH (ref <0.50)

## 2022-05-19 MED ORDER — VITAMIN D (ERGOCALCIFEROL) 1.25 MG (50000 UNIT) PO CAPS: 50000.0000 [IU] | ORAL_CAPSULE | ORAL | 1 refills | Status: DC

## 2022-05-19 MED ORDER — VITAMIN B-12 1000 MCG PO TABS: 1000.0000 ug | ORAL_TABLET | Freq: Every day | ORAL | 2 refills | Status: DC

## 2022-05-19 MED ORDER — IRON (FERROUS SULFATE) 325 (65 FE) MG PO TABS: 325.0000 mg | ORAL_TABLET | ORAL | 2 refills | Status: DC

## 2022-05-19 NOTE — Telephone Encounter (Signed)
Pt viewed lab results on mychart, requesting a call to discuss

## 2022-05-19 NOTE — Telephone Encounter (Signed)
Yes I'm planning to call her today

## 2022-05-19 NOTE — Telephone Encounter (Signed)
Noted  

## 2022-05-19 NOTE — Addendum Note (Signed)
Addended by: Farrel Conners on: 05/19/2022 10:17 AM   Modules accepted: Orders

## 2022-05-20 ENCOUNTER — Encounter: Payer: Self-pay | Admitting: Hematology and Oncology

## 2022-05-21 ENCOUNTER — Encounter: Payer: Self-pay | Admitting: Hematology and Oncology

## 2022-06-01 ENCOUNTER — Ambulatory Visit (HOSPITAL_COMMUNITY): Payer: No Typology Code available for payment source

## 2022-08-15 ENCOUNTER — Other Ambulatory Visit: Payer: Self-pay | Admitting: Family Medicine

## 2022-08-15 DIAGNOSIS — E538 Deficiency of other specified B group vitamins: Secondary | ICD-10-CM

## 2022-08-29 ENCOUNTER — Emergency Department (HOSPITAL_COMMUNITY)
Admission: EM | Admit: 2022-08-29 | Discharge: 2022-08-29 | Disposition: A | Discharge status: Home / Self Care | Payer: No Typology Code available for payment source | Attending: Emergency Medicine | Admitting: Emergency Medicine

## 2022-08-29 ENCOUNTER — Emergency Department (HOSPITAL_COMMUNITY): Payer: No Typology Code available for payment source

## 2022-08-29 ENCOUNTER — Other Ambulatory Visit: Payer: Self-pay

## 2022-08-29 ENCOUNTER — Encounter (HOSPITAL_COMMUNITY): Payer: Self-pay

## 2022-08-29 DIAGNOSIS — M25551 Pain in right hip: Secondary | ICD-10-CM | POA: Insufficient documentation

## 2022-08-29 DIAGNOSIS — Y92410 Unspecified street and highway as the place of occurrence of the external cause: Secondary | ICD-10-CM | POA: Diagnosis not present

## 2022-08-29 DIAGNOSIS — M25561 Pain in right knee: Secondary | ICD-10-CM | POA: Diagnosis not present

## 2022-08-29 LAB — POC URINE PREG, ED: Preg Test, Ur: NEGATIVE

## 2022-08-29 MED ORDER — CYCLOBENZAPRINE HCL 10 MG PO TABS: 10.0000 mg | ORAL_TABLET | Freq: Two times a day (BID) | ORAL | 0 refills | Status: DC | PRN

## 2022-08-29 MED ORDER — ACETAMINOPHEN 500 MG PO TABS
1000.0000 mg | ORAL_TABLET | Freq: Once | ORAL | Status: AC
  Administered 2022-08-29: 1000 mg via ORAL
  Filled 2022-08-29: qty 2

## 2022-08-29 NOTE — Discharge Instructions (Addendum)
Your X-rays today were normal. Please take tylenol/ibuprofen and flexeril for pain. I recommend close follow-up with PCP for reevaluation.  Please do not hesitate to return to emergency department if worrisome signs symptoms we discussed become apparent.
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
## 2022-09-28 ENCOUNTER — Ambulatory Visit (INDEPENDENT_AMBULATORY_CARE_PROVIDER_SITE_OTHER): Payer: No Typology Code available for payment source | Admitting: Family Medicine

## 2022-09-28 ENCOUNTER — Encounter: Payer: Self-pay | Admitting: Family Medicine

## 2022-09-28 VITALS — BP 118/58 | HR 95 | Temp 98.0°F | Ht 62.0 in | Wt 138.5 lb

## 2022-09-28 DIAGNOSIS — F322 Major depressive disorder, single episode, severe without psychotic features: Secondary | ICD-10-CM

## 2022-09-28 DIAGNOSIS — Z599 Problem related to housing and economic circumstances, unspecified: Secondary | ICD-10-CM

## 2022-09-28 MED ORDER — ESCITALOPRAM OXALATE 10 MG PO TABS
10.0000 mg | ORAL_TABLET | Freq: Every day | ORAL | 5 refills | Status: DC
Start: 2022-09-28 — End: 2023-02-22

## 2022-09-28 NOTE — Assessment & Plan Note (Signed)
Stopped sertraline due to stomach cramps. I recommended we try lexapro 10 mg daily, this should have less GI side effects, she should continue the hydroxyzine at night for sleep. I am placing a referral to our social worker to help her obtain some community resources to help her during this time. She will see me in 4 weeks to follow up on the medication
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
## 2022-10-26 ENCOUNTER — Telehealth (INDEPENDENT_AMBULATORY_CARE_PROVIDER_SITE_OTHER): Payer: No Typology Code available for payment source | Admitting: Family Medicine

## 2022-10-26 ENCOUNTER — Encounter: Payer: Self-pay | Admitting: Family Medicine

## 2022-10-26 DIAGNOSIS — F322 Major depressive disorder, single episode, severe without psychotic features: Secondary | ICD-10-CM

## 2022-10-26 NOTE — Progress Notes (Signed)
   Virtual Medical Office Visit  Patient:  Nancy Mcmahon      Age: 37 y.o.       Sex:  female  Date:   10/26/2022  PCP:    Karie Georges, MD   Today's Healthcare Provider: Karie Georges, MD    Assessment/Plan:   Summary assessment:  Nancy Mcmahon was seen today for medical management of chronic issues.  Current severe episode of major depressive disorder without psychotic features without prior episode [facility]) Assessment & Plan: Patient's symptoms appear somewhat improved. Will continue the 10 mg lexapro daily, no SI today, will perform another video visit in 3 months to reassess her symptoms.       Return in about 3 months (around 01/26/2023) for video visit for follow up .   She was advised to call the office or go to ER if her condition worsens    Subjective:   Nancy Mcmahon is a 37 y.o. female with PMH significant for: Past Medical History:  Diagnosis Date   Allergy    Anemia    Childhood asthma      Presenting today with: Chief Complaint  Patient presents with   Medical Management of Chronic Issues     She clarifies and reports that her condition: She reports that the medication initially seemed to be working better, however she says that "things progressed" and she is currently looking for a house to move into and she is still under quite a bit of stress at this time. Reports that she did not hear from the social worker.  States that the medication is working ok, about the same as previous.  She denies having any: No side effects reported.   No SI, or thoughts of harming herself.          Objective/Observations  Physical Exam:  Polite and friendly Gen: NAD, resting comfortably Pulm: Normal work of breathing Neuro: Grossly normal, moves all extremities Psych: Normal affect and thought content Problem specific physical exam findings:    No images are attached to the encounter or orders placed in the encounter.    Results: No results found  for any visits on 10/26/22.      Virtual Visit via Video   I connected with Nancy Mcmahon on 10/26/22 at  1:00 PM EDT by a video enabled telemedicine application and verified that I am speaking with the correct person using two identifiers. The limitations of evaluation and management by telemedicine and the availability of in person appointments were discussed. The patient expressed understanding and agreed to proceed.   Percentage of appointment time on video:  100% Patient location: Home Provider location: [place] [facility] Persons participating in the virtual visit: Myself and Patient

## 2022-10-26 NOTE — Assessment & Plan Note (Signed)
Patient's symptoms appear somewhat improved. Will continue the 10 mg lexapro daily, no SI today, will perform another video visit in 3 months to reassess her symptoms.

## 2022-12-08 ENCOUNTER — Telehealth: Payer: Self-pay | Admitting: Family Medicine

## 2022-12-08 NOTE — Telephone Encounter (Signed)
Pt called to say she needs her FMLA paperwork extension by 12/24/22. Pt states she discussed FMLA with MD during last visit. Pt is asking if she can just drop off the paperwork or does she need another appointment? Please call Pt back to discuss.

## 2022-12-08 NOTE — Telephone Encounter (Signed)
She can drop it off.

## 2022-12-08 NOTE — Telephone Encounter (Signed)
Patient informed of the message below.

## 2022-12-21 ENCOUNTER — Telehealth: Payer: Self-pay | Admitting: Family Medicine

## 2022-12-21 NOTE — Telephone Encounter (Signed)
Patient dropped off document FMLA and DMV, to be filled out by provider. Patient requested to send it back via Call Patient to pick up within 7-days. Document is located in providers tray at front office.Please advise at Mobile [phone] (mobile)

## 2022-12-22 NOTE — Telephone Encounter (Signed)
Spoke with the patient, informed her PCP completed the forms at no charge and this was left at the front desk for pick up.  Copy sent to be scanned.

## 2023-01-18 ENCOUNTER — Ambulatory Visit: Payer: No Typology Code available for payment source | Admitting: Family Medicine

## 2023-02-22 ENCOUNTER — Encounter: Payer: Self-pay | Admitting: Family Medicine

## 2023-02-22 ENCOUNTER — Telehealth (INDEPENDENT_AMBULATORY_CARE_PROVIDER_SITE_OTHER): Payer: No Typology Code available for payment source | Admitting: Family Medicine

## 2023-02-22 DIAGNOSIS — G8929 Other chronic pain: Secondary | ICD-10-CM | POA: Diagnosis not present

## 2023-02-22 DIAGNOSIS — F322 Major depressive disorder, single episode, severe without psychotic features: Secondary | ICD-10-CM | POA: Diagnosis not present

## 2023-02-22 DIAGNOSIS — M545 Low back pain, unspecified: Secondary | ICD-10-CM

## 2023-02-22 MED ORDER — CYCLOBENZAPRINE HCL 10 MG PO TABS
10.0000 mg | ORAL_TABLET | Freq: Two times a day (BID) | ORAL | 2 refills | Status: DC | PRN
Start: 2023-02-22 — End: 2023-10-18

## 2023-02-22 MED ORDER — ESCITALOPRAM OXALATE 20 MG PO TABS
20.0000 mg | ORAL_TABLET | Freq: Every day | ORAL | 1 refills | Status: DC
Start: 2023-02-22 — End: 2023-10-18
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
## 2023-02-22 NOTE — Assessment & Plan Note (Addendum)
Chronic and ongoing. Pt still reporting symptoms, will increase lexapro to 20 mg daily and see her back in 3 months for her annual physical. If this is not effective we may consider adding wellbutrin at that time.

## 2023-02-22 NOTE — Patient Instructions (Addendum)
Ibuprofen 800 mg every 8 hours PRN  Biofreeze Aspercreme Icy hot   Heating pads massage

## 2023-02-22 NOTE — Progress Notes (Signed)
Patient unable to obtain vital signs at home for virtual visit.

## 2023-02-28 ENCOUNTER — Telehealth: Payer: No Typology Code available for payment source | Admitting: Family Medicine

## 2023-03-07 IMAGING — DX DG CERVICAL SPINE COMPLETE 4+V
5 series · 5 of 5 positions shown · non-contrast
Comparison: None.

CLINICAL DATA: Neck pain.

EXAM:
CERVICAL SPINE - COMPLETE 4+ VIEW

[cervical spine lat]
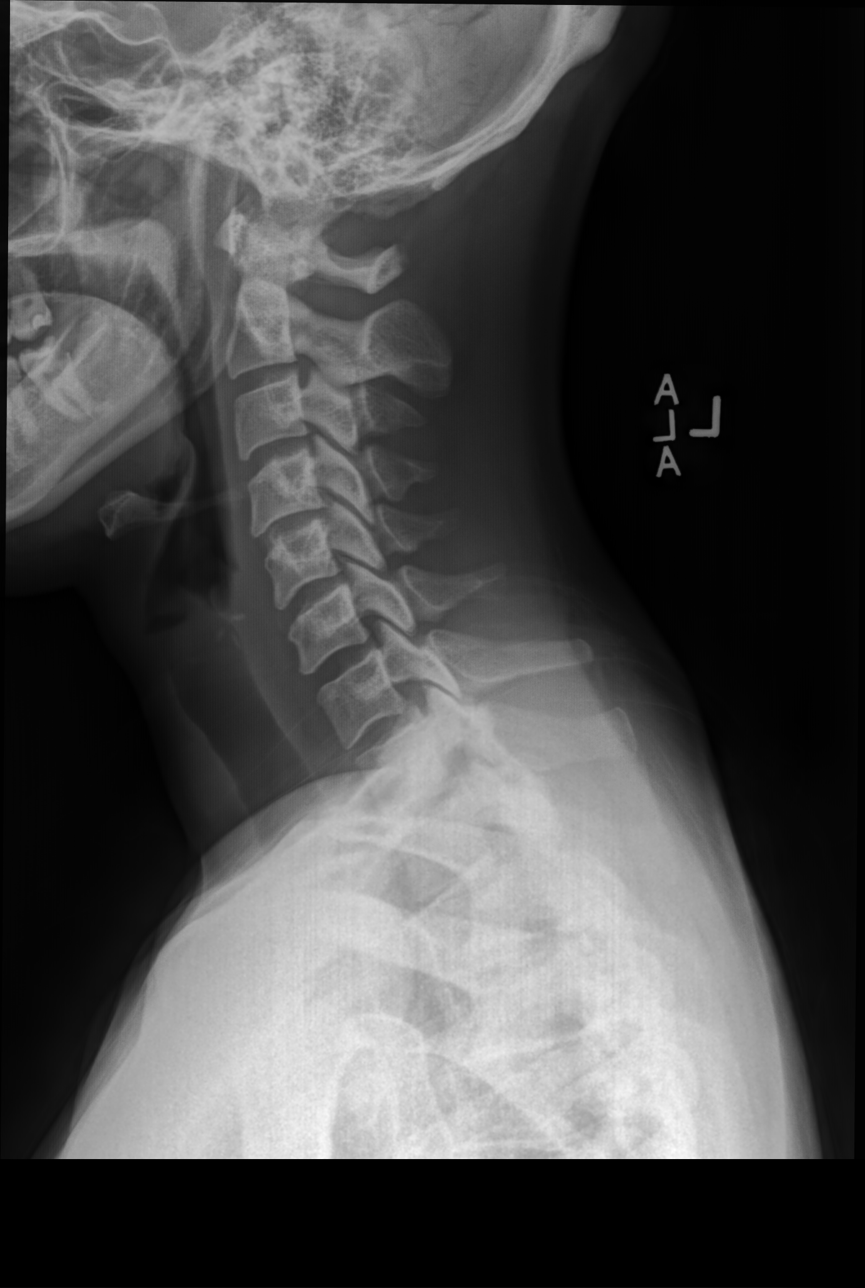

[cervical spine oblique (1 of 2)]
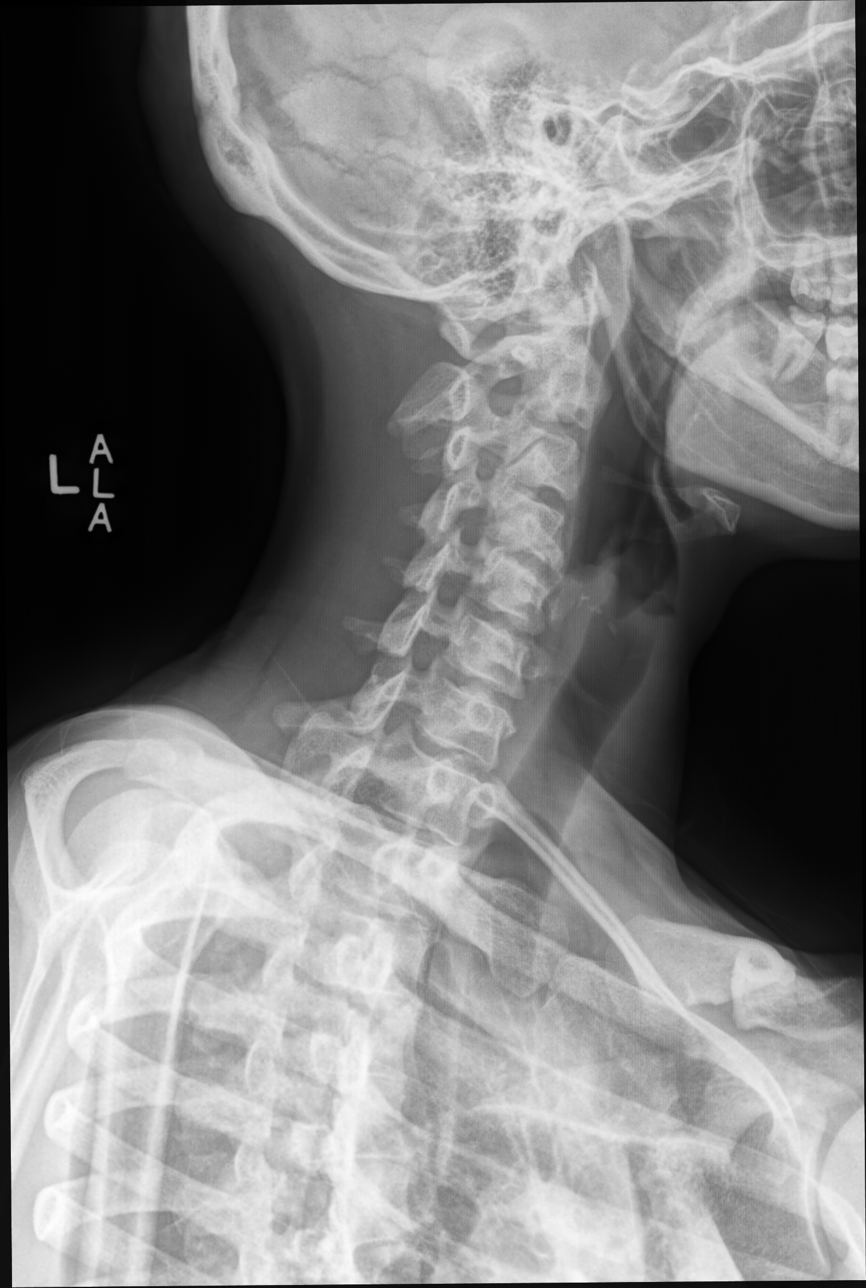

[cervical spine oblique (2 of 2)]
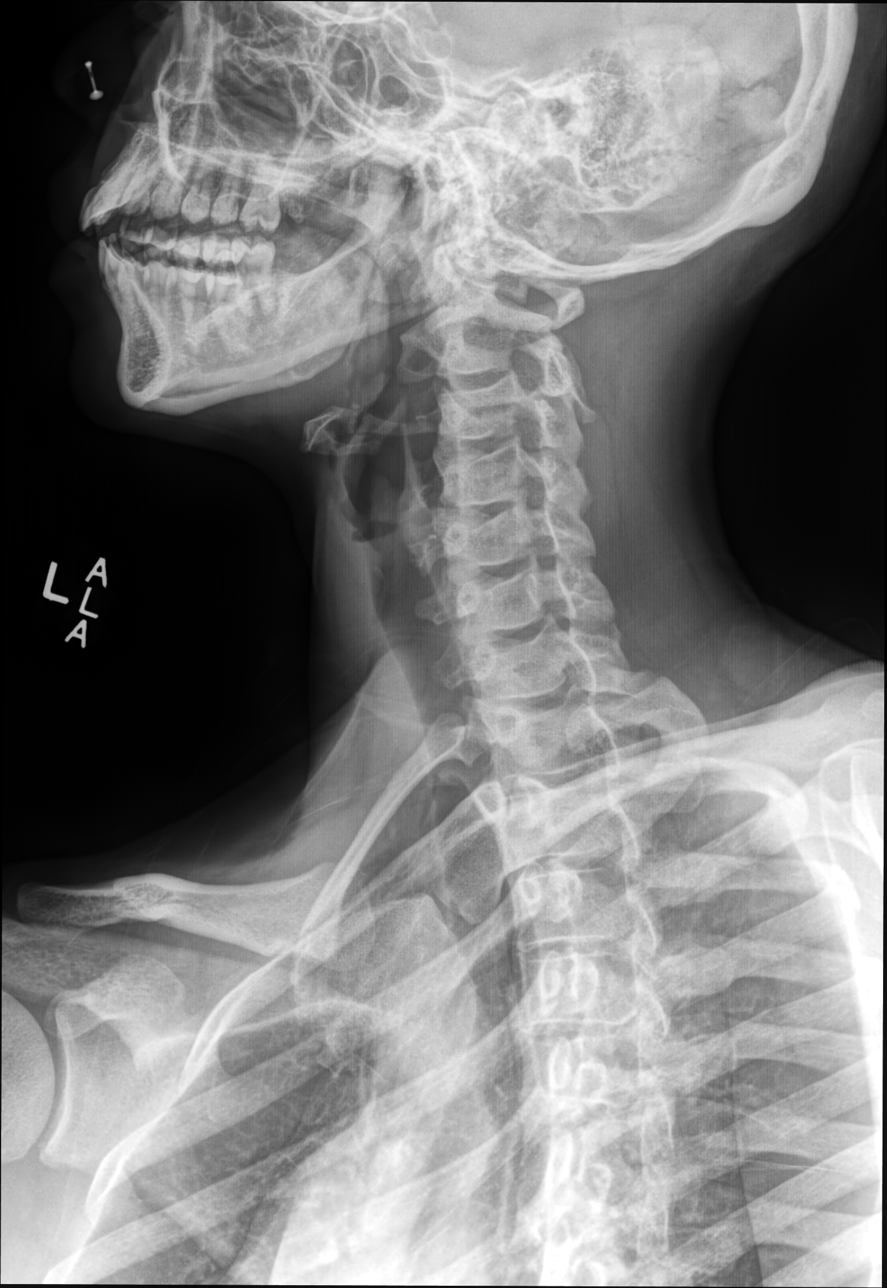

[cervical spine ap]
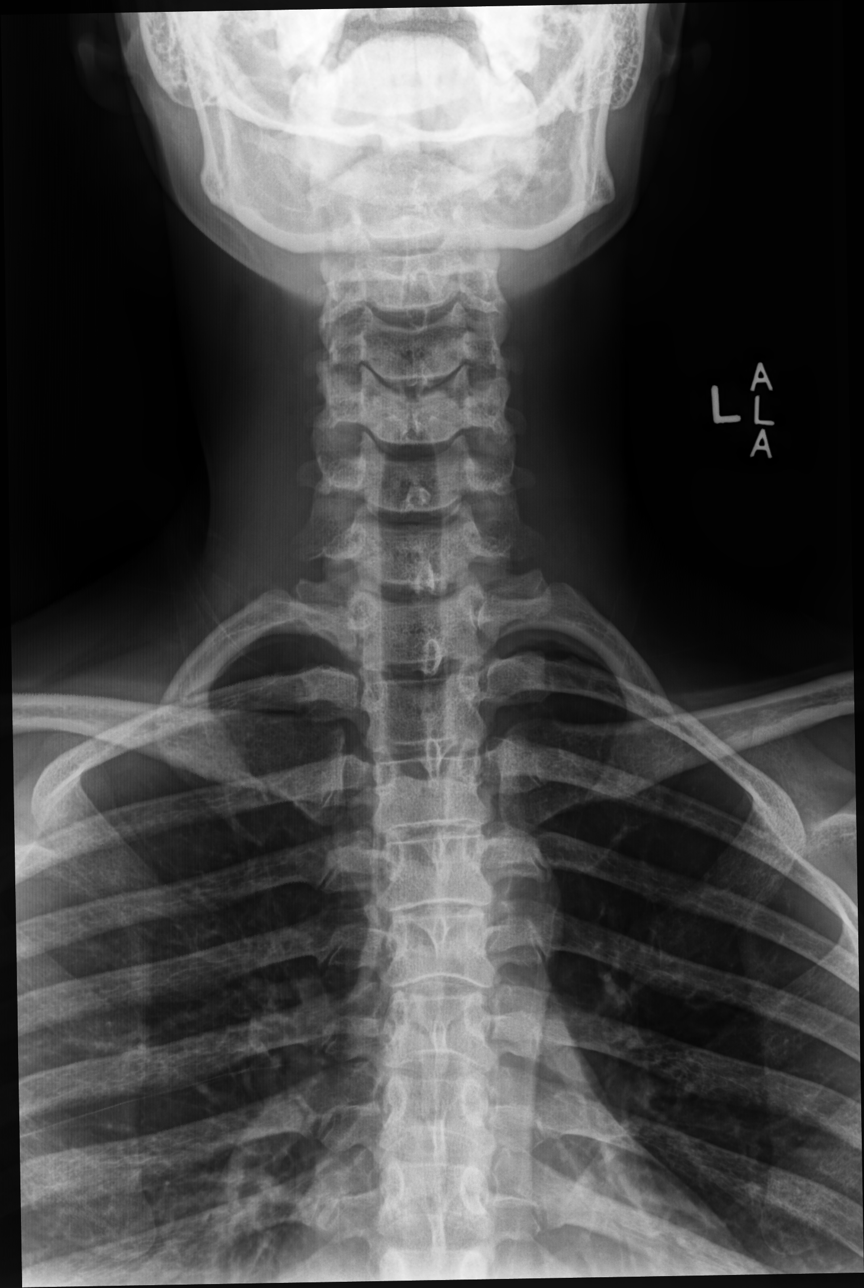

[cervical spine open mouth ap]
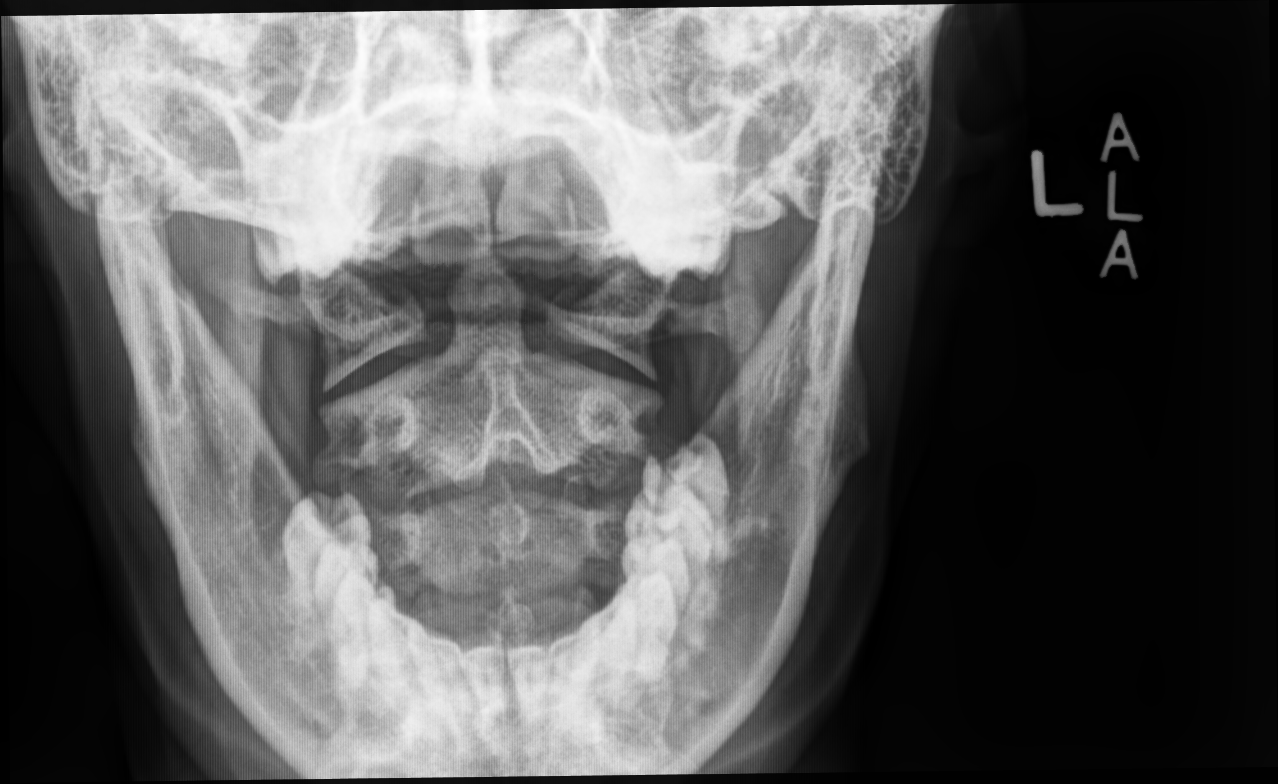

[5 of 5 positions shown; findings below may reference images not displayed]

FINDINGS: There is no evidence of cervical spine fracture or prevertebral soft
tissue swelling. Alignment is normal. No other significant bone
abnormalities are identified.
IMPRESSION: Negative cervical spine radiographs.

## 2023-03-07 IMAGING — DX DG THORACIC SPINE 3V
3 series · 3 of 3 positions shown · non-contrast
Comparison: None.

CLINICAL DATA: Back pain.

EXAM:
THORACIC SPINE - 3 VIEWS

[thoracic spine ap]
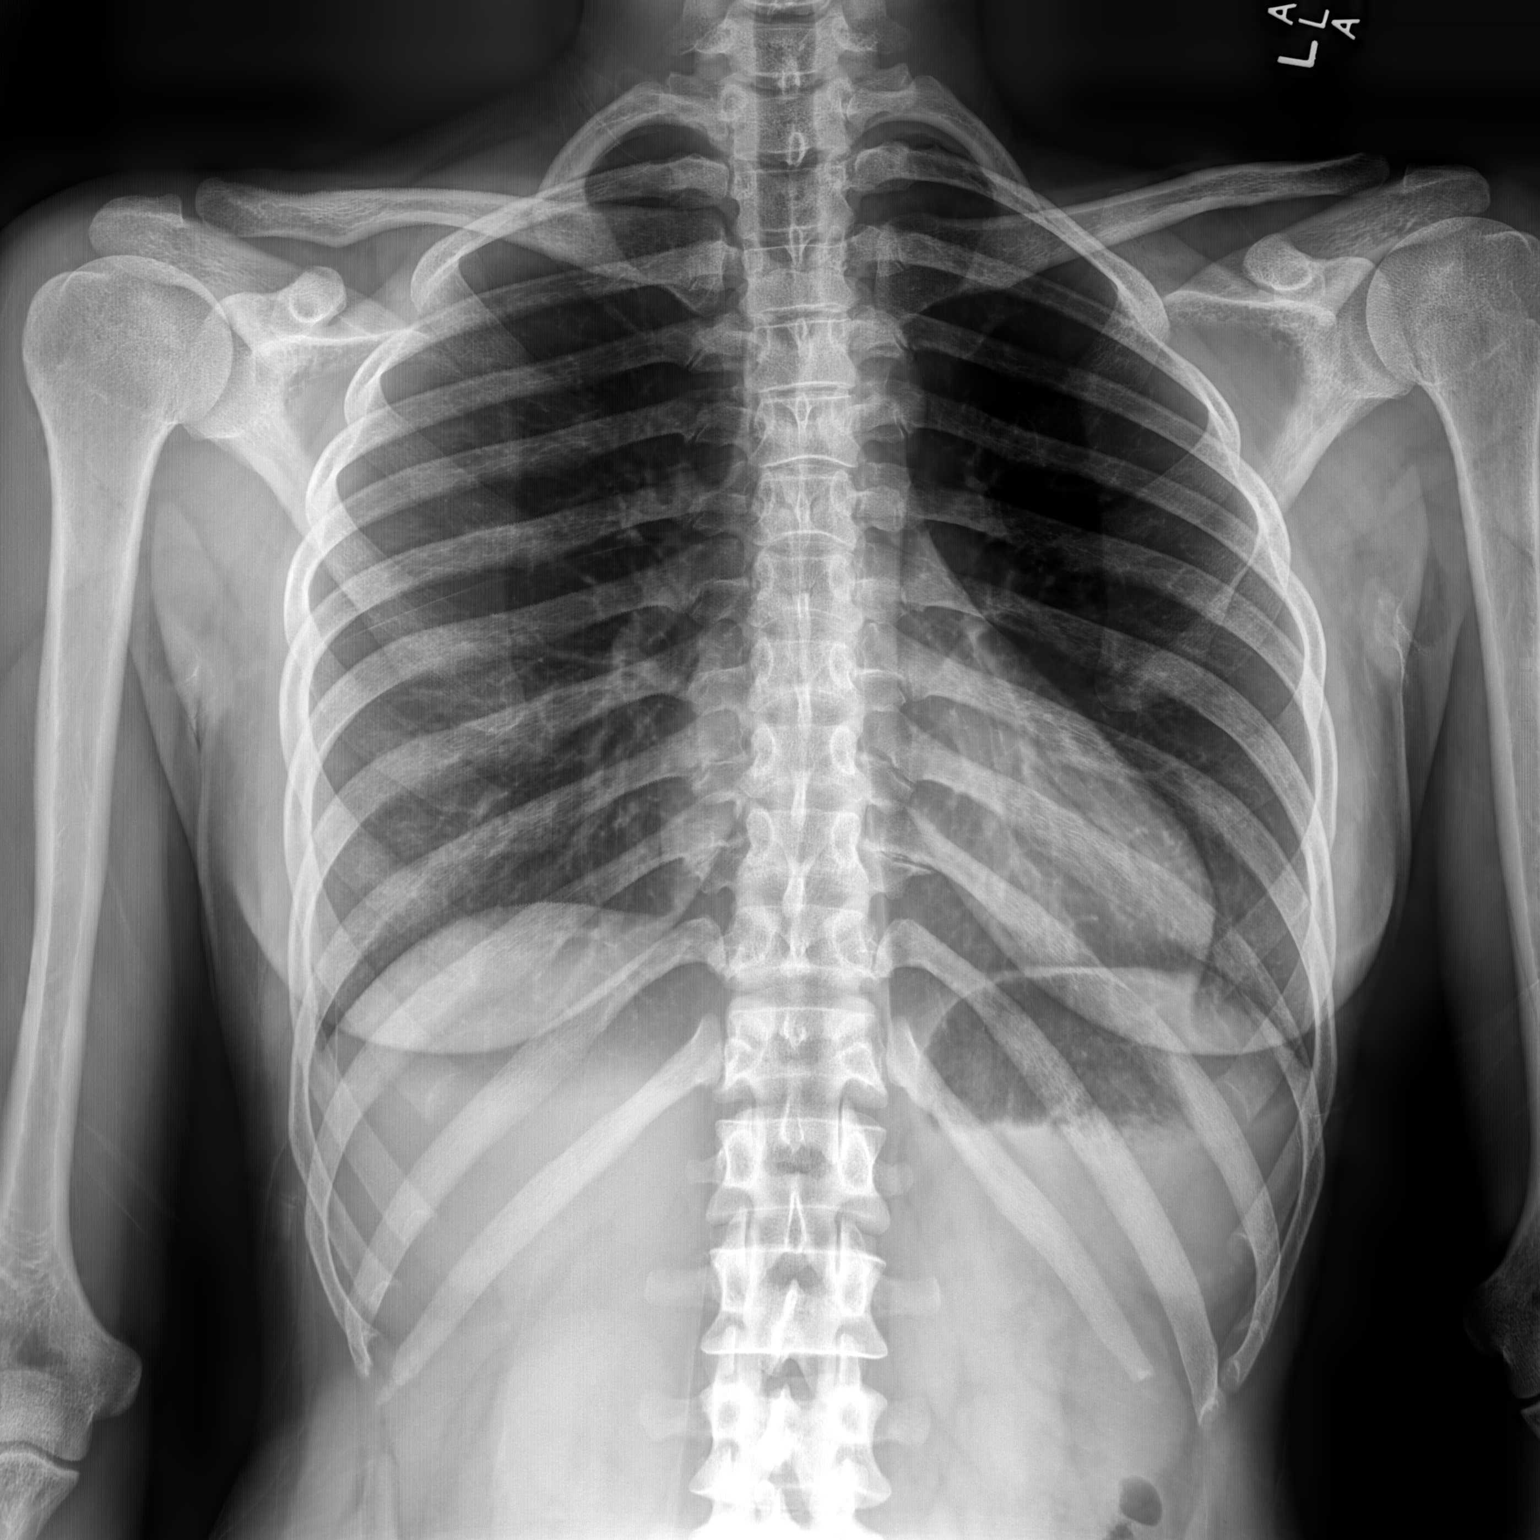

[thoracic spine lat]
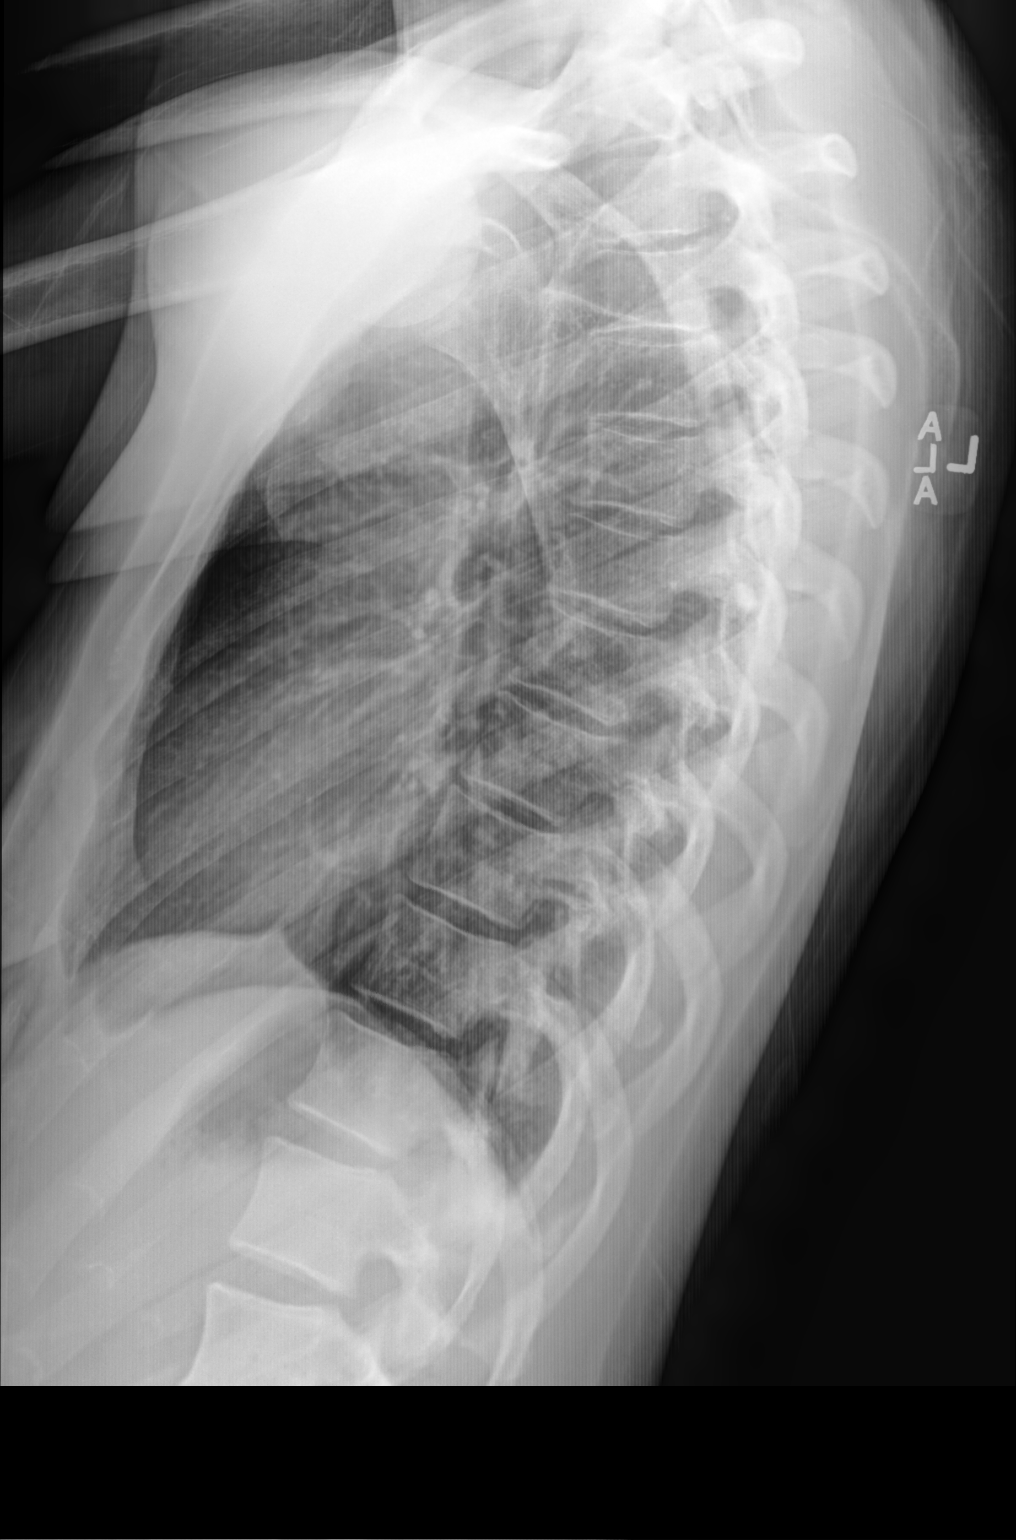

[cervico-thoracic (swimmers) lat]
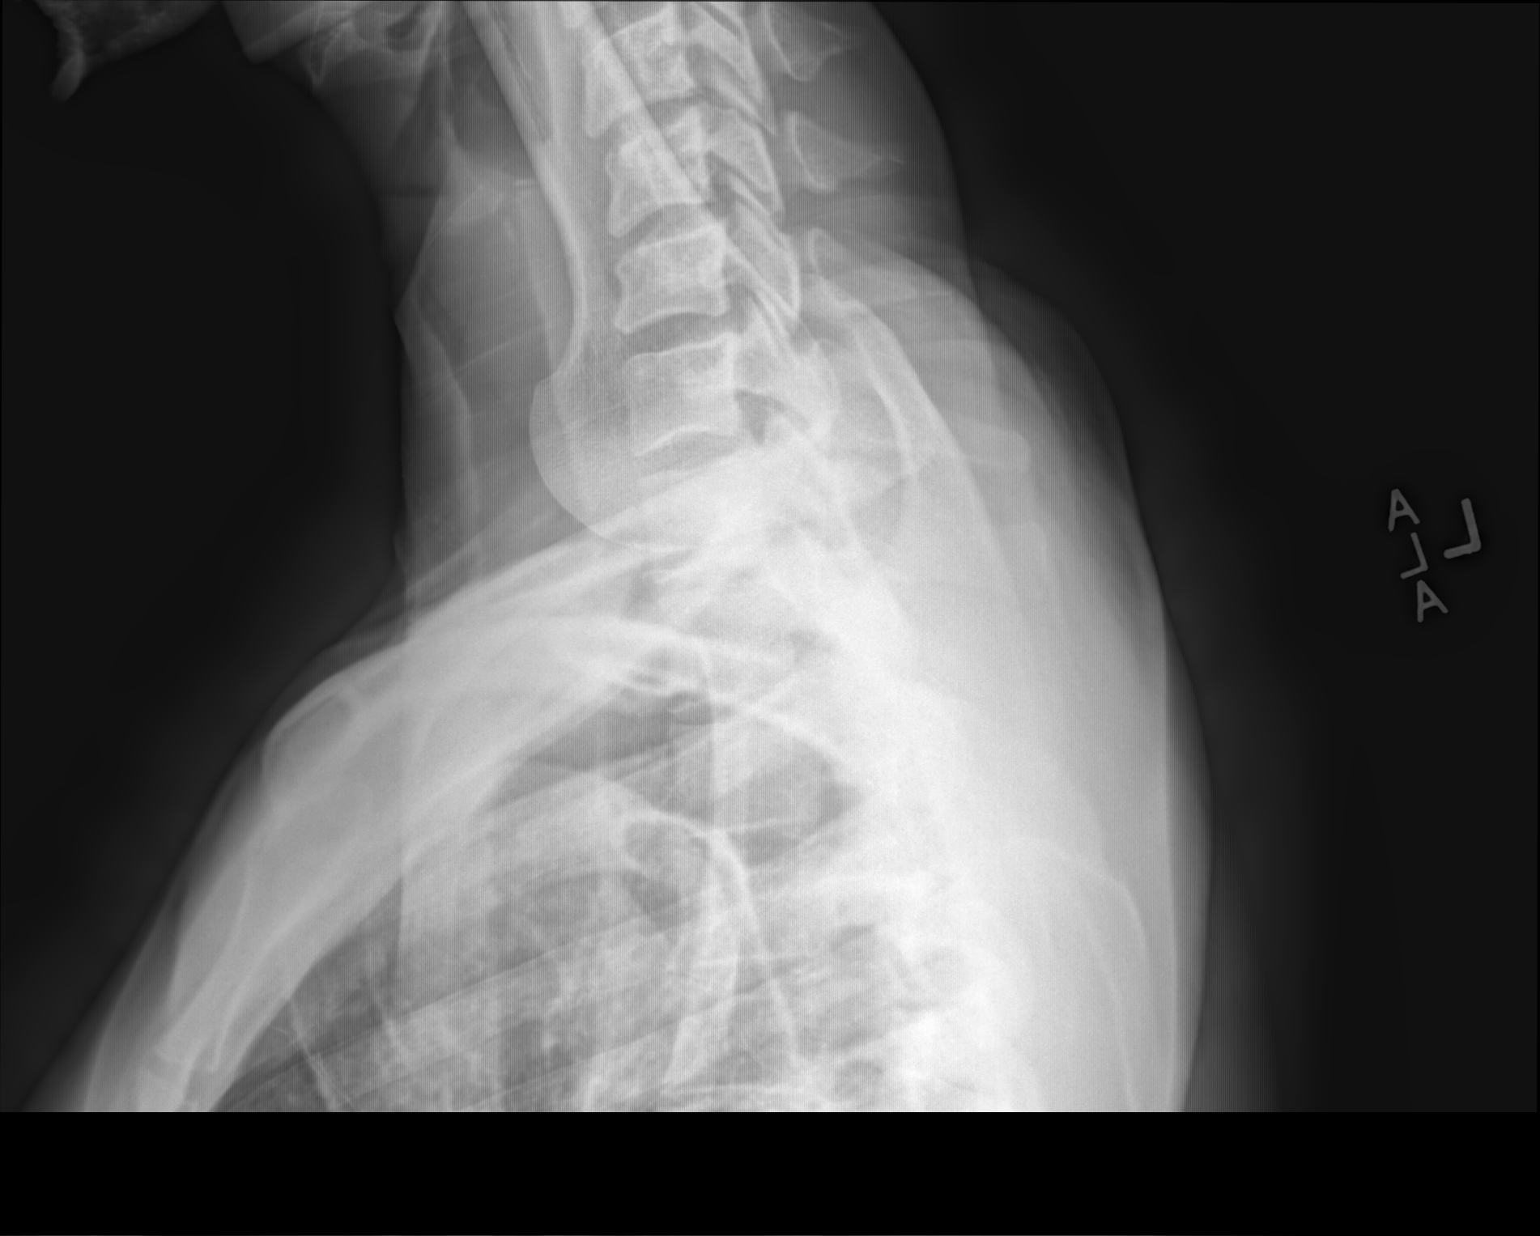

[3 of 3 positions shown; findings below may reference images not displayed]

FINDINGS: There is no evidence of thoracic spine fracture. Alignment is
normal. No other significant bone abnormalities are identified.
IMPRESSION: Negative.

## 2023-06-28 ENCOUNTER — Ambulatory Visit: Admitting: Family Medicine

## 2023-06-28 ENCOUNTER — Telehealth (INDEPENDENT_AMBULATORY_CARE_PROVIDER_SITE_OTHER): Admitting: Family Medicine

## 2023-06-28 ENCOUNTER — Encounter: Payer: Self-pay | Admitting: Family Medicine

## 2023-06-28 DIAGNOSIS — F322 Major depressive disorder, single episode, severe without psychotic features: Secondary | ICD-10-CM

## 2023-06-28 MED ORDER — ARIPIPRAZOLE 5 MG PO TABS
5.0000 mg | ORAL_TABLET | Freq: Every day | ORAL | 2 refills | Status: DC
Start: 2023-06-28 — End: 2023-07-25

## 2023-06-28 NOTE — Progress Notes (Signed)
 Patient unable to perform vital signs at home for the virtual visit.

## 2023-06-28 NOTE — Progress Notes (Signed)
   Virtual Medical Office Visit  Patient:  Nancy Mcmahon      Age: 38 y.o.       Sex:  female  Date:   06/28/2023  PCP:    Karie Georges, MD   Today's Healthcare Provider: Karie Georges, MD    Assessment/Plan:   Summary assessment:  Kimberlynn was seen today for anxiety.  Current severe episode of major depressive disorder without psychotic features without prior episode (HCC) -     ARIPiprazole; Take 1 tablet (5 mg total) by mouth daily.  Dispense: 30 tablet; Refill: 2   Pt is extremely stressed and crying in the visit today. I counseled her on adding abilify to her escitalopram 20 mg daily and she is agreeable. I also think that we need to change her FMLA to allow for more breaks in between phone calls. I will see her back in 6-8 weeks to follow up in the office and also to check annual blood work   Return in about 6 weeks (around 08/09/2023) for annual physical exam.   She was advised to call the office or go to ER if her condition worsens    Subjective:   Nancy Mcmahon is a 38 y.o. female with PMH significant for: Past Medical History:  Diagnosis Date   Allergy    Anemia    Childhood asthma      Presenting today with: Chief Complaint  Patient presents with   Anxiety    Patient complains of worsening anxiety x1 month     She clarifies and reports that her condition: Anxiety/ depression -- patient reports that the 20 mg dosing is better, however she still is having a lot of stress from her job and also with her ex-husband and custody issues. She reports that she is looking for another job and is trying to find something else.  Patient reports feelings of worthlessness, sadness, nervousness, and she is also having difficulty sleeping at night, states she is fearful of being fired from her job, etc. Is reporting she is having "stress bowel movements" and she reports she "gets in trouble" if she takes too many breaks at work.   She denies having any: Side effects           Objective/Observations  Physical Exam:  Polite and friendly Gen: NAD, resting comfortably Pulm: Normal work of breathing Neuro: Grossly normal, moves all extremities Psych: Normal affect and thought content Problem specific physical exam findings:  N/A  No images are attached to the encounter or orders placed in the encounter.    Results: No results found for any visits on 06/28/23.   No results found for this or any previous visit (from the past 2160 hours).         Virtual Visit via Video   I connected with [name] on 06/28/23 at  1:30 PM EDT by a video enabled telemedicine application and verified that I am speaking with the correct person using two identifiers. The limitations of evaluation and management by telemedicine and the availability of in person appointments were discussed. The patient expressed understanding and agreed to proceed.   Percentage of appointment time on video:  100% Patient location: Home Provider location: [place] [facility] Persons participating in the virtual visit: Myself and Patient

## 2023-07-04 ENCOUNTER — Telehealth: Payer: Self-pay

## 2023-07-04 ENCOUNTER — Telehealth: Payer: Self-pay | Admitting: Family Medicine

## 2023-07-04 NOTE — Telephone Encounter (Signed)
 Copied from CRM [id number]. Topic: General - Other >> Jul 04, 2023  8:24 AM Nancy Mcmahon wrote: Reason for CRM: Patient stated that she has to bring Dr.Michael her FMLA paperwork to sign today. Patients wants to know if she will receive the signed FMLA paperwork back today.

## 2023-07-04 NOTE — Telephone Encounter (Signed)
 Patient dropped off document FMLA, to be filled out by provider. Patient requested to send it back via Call Patient to pick up within 5-days. Document is located in providers tray at front office.Please advise at Mobile [phone] (mobile)

## 2023-07-05 ENCOUNTER — Encounter: Payer: Self-pay | Admitting: Family Medicine

## 2023-07-05 NOTE — Telephone Encounter (Signed)
Placed in the red folder. 

## 2023-07-05 NOTE — Telephone Encounter (Signed)
 See My chart message

## 2023-07-05 NOTE — Telephone Encounter (Signed)
 Tomorrow is my admin say when I usually complete paperwork

## 2023-07-06 DIAGNOSIS — Z0279 Encounter for issue of other medical certificate: Secondary | ICD-10-CM

## 2023-07-07 NOTE — Telephone Encounter (Signed)
 I think I did this one yesterday

## 2023-07-07 NOTE — Telephone Encounter (Signed)
 See My chart message

## 2023-07-14 ENCOUNTER — Telehealth: Payer: Self-pay | Admitting: Family Medicine

## 2023-07-14 NOTE — Telephone Encounter (Signed)
 Patient dropped off document FMLA, to be filled out by provider. Patient requested to send it back via Fax within 5-days. Document is located in providers tray at front office.Please advise at Mobile [phone] (mobile) Pt also requests a call to pick up copies of forms once completed

## 2023-07-17 NOTE — Telephone Encounter (Signed)
 Done. Ok to close task

## 2023-07-18 ENCOUNTER — Telehealth: Payer: Self-pay | Admitting: *Deleted

## 2023-07-18 NOTE — Progress Notes (Signed)
 Complex Care Management Note  Care Guide Note 07/18/2023 Name: Nancy Mcmahon MRN: [id number] DOB: [birth year]  Nancy Mcmahon is a 39 y.o. year old female who sees Aida House, MD for primary care. I reached out to Nancy Mcmahon by phone today to offer complex care management services.  [name] was given information about Complex Care Management services today including:   The Complex Care Management services include support from the care team which includes your Nurse Care Manager, Clinical Social Worker, or Pharmacist.  The Complex Care Management team is here to help remove barriers to the health concerns and goals most important to you. Complex Care Management services are voluntary, and the patient may decline or stop services at any time by request to their care team member.   Complex Care Management Consent Status: Patient agreed to services and verbal consent obtained.   Follow up plan:  Telephone appointment with complex care management team member scheduled for:  07/19/2023  Encounter Outcome:  Patient Scheduled  Kandis Ormond, CMA, [facility], [facility] Guide Direct Dial: [phone]  Fax: [phone] Website: [email or web address]

## 2023-07-19 ENCOUNTER — Other Ambulatory Visit: Payer: Self-pay

## 2023-07-19 NOTE — Patient Outreach (Signed)
 Complex Care Management   Visit Note  07/19/2023  Name:  Nancy Mcmahon MRN: [id number] DOB: [birth year]  Situation: Referral received for Complex Care Management related to SDOH Barriers:  Utilities assistance.  I obtained verbal consent from Patient.  Visit completed with Patient  on the phone  Background:   Past Medical History:  Diagnosis Date   Allergy    Anemia    Childhood asthma     Assessment: Patient Reported Symptoms:  Cognitive        Neurological      HEENT        Cardiovascular      Respiratory      Endocrine      Gastrointestinal        Genitourinary      Integumentary      Musculoskeletal          Psychosocial       Quality of Family Relationships: non-existent Do you feel physically threatened by others?: No      06/28/2023    1:14 PM  Depression screen PHQ 2/9  Decreased Interest 2  Down, Depressed, Hopeless 1  PHQ - 2 Score 3  Altered sleeping 3  Tired, decreased energy 3  Change in appetite 0  Feeling bad or failure about yourself  0  Trouble concentrating 1  Moving slowly or fidgety/restless 3  Suicidal thoughts 2  PHQ-9 Score 15    There were no vitals filed for this visit.  Medications Reviewed Today   Medications were not reviewed in this encounter     Recommendation:   Patient reports she will be able to pay her utility bill next month.  Patient moved in August 2023. Rent is $900.  Winter power bill was higher and a payment plan was set up.  Patient was denied assistance at DSS due to income and child support.  SW recommended to apply for [employer] in December, [employer] [facility], [employer] and [employer].  Follow Up Plan:   Patient does not request a follow up visit.  Dallis Dues, BSW [place]  [facility], [facility] Social Worker Direct Dial: [phone]  Fax: [phone] Website: [email or web address]

## 2023-07-19 NOTE — Patient Instructions (Signed)
 Visit Information  Thank you for taking time to visit with me today. Please don't hesitate to contact me if I can be of assistance to you.   Following are the goals we discussed today:   Goals Addressed             This Visit's Progress    VBCI Social Work Care Plan       Problems:   Financial Strain   CSW Clinical Goal(s):   Over the next 2 weeks the Patient will explore community resource options for unmet needs related to utilities .  Interventions:  Social Determinants of Health in Patient with  none : SDOH assessments completed: Financial Strain  Evaluation of current treatment plan related to unmet needs Patient will contact [employer], [employer], and Animal nutritionist for assistance with power bill  Patient Goals/Self-Care Activities:  Patient to contact community resources for financial assistance.  Plan:   No further follow up required: Patient does not request additional follow up.          If you are experiencing a Mental Health or Behavioral Health Crisis or need someone to talk to, please call 911  Patient verbalizes understanding of instructions and care plan provided today and agrees to view in MyChart. Active MyChart status and patient understanding of how to access instructions and care plan via MyChart confirmed with patient.     No further follow up required: Patient does not request a follow up visit.  Dallis Dues, BSW [place]  [facility], [facility] Social Worker Direct Dial: [phone]  Fax: [phone] Website: [email or web address]

## 2023-07-20 ENCOUNTER — Other Ambulatory Visit: Payer: Self-pay | Admitting: Family Medicine

## 2023-07-20 DIAGNOSIS — F322 Major depressive disorder, single episode, severe without psychotic features: Secondary | ICD-10-CM

## 2023-10-18 ENCOUNTER — Telehealth (INDEPENDENT_AMBULATORY_CARE_PROVIDER_SITE_OTHER): Admitting: Family Medicine

## 2023-10-18 ENCOUNTER — Encounter: Payer: Self-pay | Admitting: Hematology and Oncology

## 2023-10-18 ENCOUNTER — Encounter: Payer: Self-pay | Admitting: Family Medicine

## 2023-10-18 VITALS — Wt 147.0 lb

## 2023-10-18 DIAGNOSIS — E538 Deficiency of other specified B group vitamins: Secondary | ICD-10-CM

## 2023-10-18 DIAGNOSIS — F322 Major depressive disorder, single episode, severe without psychotic features: Secondary | ICD-10-CM | POA: Diagnosis not present

## 2023-10-18 DIAGNOSIS — M545 Low back pain, unspecified: Secondary | ICD-10-CM

## 2023-10-18 DIAGNOSIS — E559 Vitamin D deficiency, unspecified: Secondary | ICD-10-CM

## 2023-10-18 DIAGNOSIS — G8929 Other chronic pain: Secondary | ICD-10-CM

## 2023-10-18 DIAGNOSIS — D509 Iron deficiency anemia, unspecified: Secondary | ICD-10-CM

## 2023-10-18 MED ORDER — ARIPIPRAZOLE 10 MG PO TABS: 10.0000 mg | ORAL_TABLET | Freq: Every day | ORAL | 1 refills | Status: DC

## 2023-10-18 MED ORDER — ESCITALOPRAM OXALATE 20 MG PO TABS: 20.0000 mg | ORAL_TABLET | Freq: Every day | ORAL | 1 refills | Status: DC

## 2023-10-18 MED ORDER — CYCLOBENZAPRINE HCL 10 MG PO TABS: 10.0000 mg | ORAL_TABLET | Freq: Every day | ORAL | 5 refills | Status: DC

## 2023-10-18 NOTE — Progress Notes (Signed)
 Virtual Medical Office Visit  Patient:  Nancy Mcmahon      Age: 38 y.o.       Sex:  female  Date:   10/18/2023  PCP:    Ozell Heron HERO, MD   Today's Healthcare Provider: Heron HERO Ozell, MD    Assessment/Plan:   Summary assessment:  Wilba was seen today for insomnia and brain fog x10 days.  Vitamin D  deficiency -     VITAMIN D  25 Hydroxy (Vit-D Deficiency, Fractures); Future  Chronic bilateral low back pain without sciatica -     Cyclobenzaprine  HCl; Take 1 tablet (10 mg total) by mouth at bedtime.  Dispense: 30 tablet; Refill: 5  Current severe episode of major depressive disorder without psychotic features without prior episode (HCC) -     Escitalopram  Oxalate; Take 1 tablet (20 mg total) by mouth daily.  Dispense: 90 tablet; Refill: 1 -     ARIPiprazole ; Take 1 tablet (10 mg total) by mouth daily.  Dispense: 90 tablet; Refill: 1  Iron  deficiency anemia, unspecified iron  deficiency anemia type -     Iron , TIBC and Ferritin Panel; Future  Vitamin B12 deficiency -     Vitamin B12; Future   Patient's mood seems improved today, we discussed increasing her abilify  to 10 mg and she is agreeable. I'm concerned that the brain fog and other issues are representing recurrence of her vitamin deficiencies. I advised she go back on her vitamins and we will recheck her labs. RTC in 6 months.   Return in about 6 months (around 04/19/2024) for annual physical exam.   She was advised to call the office or go to ER if her condition worsens    Subjective:   Nancy SCHLOTZHAUER is a 38 y.o. female with PMH significant for: Past Medical History:  Diagnosis Date   Allergy    Anemia    Childhood asthma      Presenting today with: Chief Complaint  Patient presents with   Insomnia    X2 weeks, tried Melatonin with some relief initially   Brain fog x10 days     She clarifies and reports that her condition: Pt is here for follow up today for her depression symptoms. States  that when she first started the abilify  her mood initially got better, however she states that lately she has been sliding back into her depression. Thinks she might be getting used to being on the medication. States that the anniversary of her mothers death is coming up and she is having more nightmares/abnormal dreams lately, states that since she is off the muscle relaxers she is having more trouble sleeping. Doesn't think the dream are coming from the abilify .   She denies having any: Side effects          Objective/Observations  Physical Exam:  Polite and friendly Gen: NAD, resting comfortably Pulm: Normal work of breathing Neuro: Grossly normal, moves all extremities Psych: Normal affect and thought content Problem specific physical exam findings:    No images are attached to the encounter or orders placed in the encounter.    Results: No results found for any visits on 10/18/23.   No results found for this or any previous visit (from the past 2160 hours).         Virtual Visit via Video   I connected with Denicia D Follette on 10/18/23 at  2:00 PM EDT by a video enabled telemedicine application and verified that I am speaking with  the correct person using two identifiers. The limitations of evaluation and management by telemedicine and the availability of in person appointments were discussed. The patient expressed understanding and agreed to proceed.   Percentage of appointment time on video:  100% Patient location: Home Provider location: [place] [facility] Persons participating in the virtual visit: Myself and Patient

## 2023-11-14 DIAGNOSIS — S3992XA Unspecified injury of lower back, initial encounter: Secondary | ICD-10-CM | POA: Diagnosis not present

## 2023-11-14 DIAGNOSIS — M79661 Pain in right lower leg: Secondary | ICD-10-CM | POA: Diagnosis not present

## 2023-11-14 DIAGNOSIS — T1490XA Injury, unspecified, initial encounter: Secondary | ICD-10-CM | POA: Diagnosis not present

## 2023-11-14 DIAGNOSIS — M79605 Pain in left leg: Secondary | ICD-10-CM | POA: Diagnosis not present

## 2023-11-14 DIAGNOSIS — R110 Nausea: Secondary | ICD-10-CM | POA: Diagnosis not present

## 2023-11-14 DIAGNOSIS — S8991XA Unspecified injury of right lower leg, initial encounter: Secondary | ICD-10-CM | POA: Diagnosis not present

## 2023-11-14 DIAGNOSIS — M545 Low back pain, unspecified: Secondary | ICD-10-CM | POA: Diagnosis not present

## 2023-11-14 DIAGNOSIS — S8992XA Unspecified injury of left lower leg, initial encounter: Secondary | ICD-10-CM | POA: Diagnosis not present

## 2023-11-14 DIAGNOSIS — M546 Pain in thoracic spine: Secondary | ICD-10-CM | POA: Diagnosis not present

## 2023-11-14 DIAGNOSIS — S299XXA Unspecified injury of thorax, initial encounter: Secondary | ICD-10-CM | POA: Diagnosis not present

## 2023-11-14 DIAGNOSIS — M79604 Pain in right leg: Secondary | ICD-10-CM | POA: Diagnosis not present

## 2023-11-14 DIAGNOSIS — M79662 Pain in left lower leg: Secondary | ICD-10-CM | POA: Diagnosis not present

## 2023-11-16 DIAGNOSIS — T1490XA Injury, unspecified, initial encounter: Secondary | ICD-10-CM | POA: Diagnosis not present

## 2023-11-16 DIAGNOSIS — S7011XA Contusion of right thigh, initial encounter: Secondary | ICD-10-CM | POA: Diagnosis not present

## 2023-11-16 DIAGNOSIS — S39012A Strain of muscle, fascia and tendon of lower back, initial encounter: Secondary | ICD-10-CM | POA: Diagnosis not present

## 2023-11-16 DIAGNOSIS — M25552 Pain in left hip: Secondary | ICD-10-CM | POA: Diagnosis not present

## 2023-11-16 DIAGNOSIS — M25551 Pain in right hip: Secondary | ICD-10-CM | POA: Diagnosis not present

## 2023-11-16 DIAGNOSIS — M25561 Pain in right knee: Secondary | ICD-10-CM | POA: Diagnosis not present

## 2023-11-18 ENCOUNTER — Encounter: Payer: Self-pay | Admitting: Hematology and Oncology

## 2023-11-18 ENCOUNTER — Ambulatory Visit: Payer: Self-pay | Admitting: Family Medicine

## 2023-11-18 ENCOUNTER — Encounter: Payer: Self-pay | Admitting: Family Medicine

## 2023-11-18 ENCOUNTER — Other Ambulatory Visit (INDEPENDENT_AMBULATORY_CARE_PROVIDER_SITE_OTHER)

## 2023-11-18 VITALS — BP 140/80 | HR 110 | Temp 98.3°F | Ht <= 58 in | Wt 151.1 lb

## 2023-11-18 DIAGNOSIS — D509 Iron deficiency anemia, unspecified: Secondary | ICD-10-CM

## 2023-11-18 DIAGNOSIS — E538 Deficiency of other specified B group vitamins: Secondary | ICD-10-CM

## 2023-11-18 DIAGNOSIS — H8110 Benign paroxysmal vertigo, unspecified ear: Secondary | ICD-10-CM

## 2023-11-18 DIAGNOSIS — M5126 Other intervertebral disc displacement, lumbar region: Secondary | ICD-10-CM

## 2023-11-18 DIAGNOSIS — M5442 Lumbago with sciatica, left side: Secondary | ICD-10-CM

## 2023-11-18 DIAGNOSIS — M5441 Lumbago with sciatica, right side: Secondary | ICD-10-CM

## 2023-11-18 DIAGNOSIS — E559 Vitamin D deficiency, unspecified: Secondary | ICD-10-CM

## 2023-11-18 LAB — VITAMIN D 25 HYDROXY (VIT D DEFICIENCY, FRACTURES): VITD: 20.67 ng/mL — ABNORMAL LOW (ref 30.00–100.00)

## 2023-11-18 LAB — VITAMIN B12: Vitamin B-12: 193 pg/mL — ABNORMAL LOW (ref 211–911)

## 2023-11-18 MED ORDER — VITAMIN D (ERGOCALCIFEROL) 1.25 MG (50000 UNIT) PO CAPS: 50000.0000 [IU] | ORAL_CAPSULE | ORAL | 1 refills | Status: DC

## 2023-11-18 MED ORDER — CYANOCOBALAMIN 250 MCG PO TABS: 500.0000 ug | ORAL_TABLET | Freq: Every day | ORAL | 5 refills | Status: DC

## 2023-11-18 MED ORDER — TRAMADOL HCL 50 MG PO TABS
50.0000 mg | ORAL_TABLET | Freq: Three times a day (TID) | ORAL | 0 refills | Status: AC | PRN
Start: 2023-11-18 — End: 2023-11-23

## 2023-11-18 MED ORDER — ONDANSETRON 4 MG PO TBDP
4.0000 mg | ORAL_TABLET | Freq: Three times a day (TID) | ORAL | 2 refills | Status: AC | PRN
Start: 2023-11-18 — End: ?

## 2023-11-18 NOTE — Patient Instructions (Signed)
 Continue prednisone  until finished, may also take the muscle relaxer and the tramadol  with the prednisone . Once the prednisone  is finished then you can start the ibuprofen .   Take medications with food to protect the lining of your stomach.
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
## 2023-11-19 LAB — IRON,TIBC AND FERRITIN PANEL
%SAT: 3 % — ABNORMAL LOW (ref 16–45)
Ferritin: 4 ng/mL — ABNORMAL LOW (ref 16–154)
Iron: 13 ug/dL — ABNORMAL LOW (ref 40–190)
TIBC: 448 ug/dL (ref 250–450)

## 2023-11-30 DIAGNOSIS — Z0279 Encounter for issue of other medical certificate: Secondary | ICD-10-CM

## 2023-12-09 ENCOUNTER — Telehealth: Payer: Self-pay | Admitting: *Deleted

## 2023-12-09 DIAGNOSIS — M545 Low back pain, unspecified: Secondary | ICD-10-CM

## 2023-12-09 DIAGNOSIS — M5441 Lumbago with sciatica, right side: Secondary | ICD-10-CM

## 2023-12-09 NOTE — Telephone Encounter (Signed)
 Patient informed of the message below.  Patient stated the MRI has been scheduled towards the end of this month and she would like to be referred to a spine specialist in the [place] area.  Message sent to PCP.
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
## 2023-12-09 NOTE — Telephone Encounter (Signed)
 Is she scheduled for her MRI yet? I can also send her to the spine specialist for evaluation if she would like.

## 2023-12-12 NOTE — Telephone Encounter (Signed)
Referral placed.     Ok to close.

## 2023-12-14 ENCOUNTER — Telehealth: Payer: Self-pay | Admitting: Family Medicine

## 2023-12-14 NOTE — Telephone Encounter (Signed)
 Copied from CRM [id number]. Topic: General - Other >> Dec 14, 2023  1:28 PM Kathrin PARAS wrote: Reason for CRM: [facility] rep is calling trying to see if a fax for was received for plan of care for physical therapy. Please advise [phone]

## 2023-12-16 ENCOUNTER — Telehealth: Payer: Self-pay | Admitting: Family Medicine

## 2023-12-16 NOTE — Telephone Encounter (Signed)
 Copied from CRM #[id number]. Topic: General - Other >> Dec 16, 2023  3:28 PM Viola F wrote: Reason for CRM: Patient wants to know the status of the short term disability paperwork/attending physician statement - she needs done as soon as possible due to work. Monday/Tuesday the latest. Please call her at [phone] with an update.

## 2023-12-19 NOTE — Telephone Encounter (Signed)
 Signed by PCP, faxed  and sent to be scanned.

## 2023-12-26 NOTE — Telephone Encounter (Signed)
 See prior Mychart message with same request.

## 2023-12-26 NOTE — Telephone Encounter (Signed)
 Copied from CRM #[id number]. Topic: General - Other >> Dec 26, 2023 10:49 AM Dedra B wrote: Reason for CRM: Pt will like Dr. Heddie nurse to call her call regarding the best way to go about getting her short term disability and FMLA extended. She said she has two sets of paperwork and would rather explain it over the phone.

## 2023-12-27 ENCOUNTER — Telehealth: Payer: Self-pay | Admitting: Family Medicine

## 2023-12-27 NOTE — Telephone Encounter (Signed)
Duplicate message ok to close

## 2023-12-27 NOTE — Telephone Encounter (Signed)
 Copied from CRM #[id number]. Topic: General - Other >> Dec 26, 2023 10:49 AM Dedra B wrote: Reason for CRM: Pt will like Dr. Heddie nurse to call her call regarding the best way to go about getting her short term disability and FMLA extended. She said she has two sets of paperwork and would rather explain it over the phone. >> Dec 27, 2023  9:42 AM Robinson H wrote: Patient following up on previous message, patient is trying to extend [facility] and change return to work day is November 3rd instead of October 2nd to complete physical therapy, supposed to return to work next Thursday. Patient will pick up paperwork from office instead of office faxing paperwork. Also the attending physician statement for her short term disability with [employer] needs to be updated with dates as well.  Sharene [phone]

## 2023-12-28 NOTE — Telephone Encounter (Signed)
 See prior note

## 2024-01-10 ENCOUNTER — Encounter: Payer: Self-pay | Admitting: Hematology and Oncology

## 2024-01-10 ENCOUNTER — Ambulatory Visit: Payer: Self-pay

## 2024-01-10 NOTE — Telephone Encounter (Signed)
Appt on 10/9

## 2024-01-10 NOTE — Telephone Encounter (Signed)
 Noted will discuss the results with her at her appt
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
## 2024-01-11 ENCOUNTER — Telehealth: Payer: Self-pay | Admitting: *Deleted

## 2024-01-11 NOTE — Telephone Encounter (Signed)
 Copied from CRM [id number]. Topic: Clinical - Lab/Test Results >> Jan 10, 2024 10:06 AM Harlene ORN wrote: Reason for CRM: Patient called requesting someone call her to discuss her MRI results.

## 2024-01-11 NOTE — Telephone Encounter (Signed)
Appt on 10/9

## 2024-01-12 ENCOUNTER — Encounter: Payer: Self-pay | Admitting: Family Medicine

## 2024-01-12 ENCOUNTER — Telehealth (INDEPENDENT_AMBULATORY_CARE_PROVIDER_SITE_OTHER): Admitting: Family Medicine

## 2024-01-12 DIAGNOSIS — M5136 Other intervertebral disc degeneration, lumbar region with discogenic back pain only: Secondary | ICD-10-CM

## 2024-01-12 NOTE — Progress Notes (Signed)
 Patient unable to perform vital signs at home for the virtual visit.
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
## 2024-01-18 ENCOUNTER — Telehealth: Payer: Self-pay | Admitting: *Deleted

## 2024-01-18 DIAGNOSIS — Z0279 Encounter for issue of other medical certificate: Secondary | ICD-10-CM

## 2024-01-24 ENCOUNTER — Ambulatory Visit (INDEPENDENT_AMBULATORY_CARE_PROVIDER_SITE_OTHER): Admitting: Family Medicine

## 2024-01-24 ENCOUNTER — Encounter: Payer: Self-pay | Admitting: Family Medicine

## 2024-01-24 VITALS — BP 132/80 | HR 130 | Temp 98.0°F | Wt 147.9 lb

## 2024-01-24 DIAGNOSIS — M25512 Pain in left shoulder: Secondary | ICD-10-CM

## 2024-01-24 DIAGNOSIS — F322 Major depressive disorder, single episode, severe without psychotic features: Secondary | ICD-10-CM | POA: Diagnosis not present

## 2024-01-24 DIAGNOSIS — M25551 Pain in right hip: Secondary | ICD-10-CM

## 2024-01-24 MED ORDER — TRAMADOL HCL 50 MG PO TABS
50.0000 mg | ORAL_TABLET | Freq: Four times a day (QID) | ORAL | 0 refills | Status: AC | PRN
Start: 2024-01-24 — End: 2024-01-29
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
## 2024-01-27 ENCOUNTER — Telehealth: Payer: Self-pay | Admitting: Family Medicine

## 2024-01-27 NOTE — Telephone Encounter (Signed)
 Copied from CRM [id number]. Topic: General - Other >> Jan 27, 2024 11:34 AM Robinson H wrote: Reason for CRM: Patient calling to verify if office received her paperwork that needs to be updated by provider for her short term disability, also wants to verify if she needs to come in or if paperwork can be completed based off last visit. States the disability wants the information from the most recent visit stating patients progress.  Sharene [phone]

## 2024-01-30 NOTE — Telephone Encounter (Signed)
 I completed the paperwork on 10/15 which was the attending physician statement

## 2024-02-02 NOTE — Telephone Encounter (Signed)
 Ok to close, paperwork filled out

## 2024-02-03 NOTE — Telephone Encounter (Signed)
 Looks like I put the wrong year-- I won't be back to the office until Monday, can you print me a copy and I'll change the year and initial it

## 2024-02-21 ENCOUNTER — Telehealth: Admitting: Family Medicine

## 2024-03-07 ENCOUNTER — Telehealth: Payer: Self-pay | Admitting: Family Medicine
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
## 2024-03-15 NOTE — Telephone Encounter (Signed)
 Copied from CRM [id number]. Topic: General - Other >> Feb 08, 2024  1:36 PM Berneda FALCON wrote: Reason for CRM: Heron PT from [facility] calling to see if we have completed the POC for this patient and would like to see if we could get PCP to sign and fax back, please.  Confirmed fax number is correct. Please be on the lookout, she is sending it now.

## 2024-03-15 NOTE — Telephone Encounter (Signed)
 Copied from CRM [id number]. Topic: Clinical - Medical Advice >> Feb 02, 2024  2:15 PM Taleah C wrote: Reason for CRM: [facility] called and stated that they faxed over an order for physical therapy on 10/10. They asked for someone to call with an update. Please call and advise at [phone].

## 2024-03-26 ENCOUNTER — Ambulatory Visit: Admitting: Family Medicine

## 2024-03-26 ENCOUNTER — Encounter: Payer: Self-pay | Admitting: Family Medicine

## 2024-03-26 DIAGNOSIS — M545 Low back pain, unspecified: Secondary | ICD-10-CM | POA: Diagnosis not present

## 2024-03-26 DIAGNOSIS — F322 Major depressive disorder, single episode, severe without psychotic features: Secondary | ICD-10-CM

## 2024-03-26 DIAGNOSIS — G8929 Other chronic pain: Secondary | ICD-10-CM | POA: Diagnosis not present

## 2024-03-26 DIAGNOSIS — E559 Vitamin D deficiency, unspecified: Secondary | ICD-10-CM

## 2024-03-26 DIAGNOSIS — E538 Deficiency of other specified B group vitamins: Secondary | ICD-10-CM | POA: Diagnosis not present

## 2024-03-26 MED ORDER — CYCLOBENZAPRINE HCL 10 MG PO TABS: 10.0000 mg | ORAL_TABLET | Freq: Every day | ORAL | 5 refills | Status: AC

## 2024-03-26 MED ORDER — ESCITALOPRAM OXALATE 20 MG PO TABS: 20.0000 mg | ORAL_TABLET | Freq: Every day | ORAL | 1 refills | Status: AC

## 2024-03-26 MED ORDER — CYANOCOBALAMIN 250 MCG PO TABS: 500.0000 ug | ORAL_TABLET | Freq: Every day | ORAL | 5 refills | Status: AC

## 2024-03-26 MED ORDER — VITAMIN D (ERGOCALCIFEROL) 1.25 MG (50000 UNIT) PO CAPS: 50000.0000 [IU] | ORAL_CAPSULE | ORAL | 1 refills | Status: AC
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
## 2024-03-30 ENCOUNTER — Encounter: Payer: Self-pay | Admitting: Family Medicine

## 2024-03-30 NOTE — Telephone Encounter (Signed)
 Done  -- in the pile I gave you!

## 2024-04-11 DIAGNOSIS — Z0279 Encounter for issue of other medical certificate: Secondary | ICD-10-CM
